# Patient Record
Sex: Female | Born: 1985 | Race: Black or African American | Hispanic: No | Marital: Single | Smoking: Never smoker
Health system: Southern US, Community
[De-identification: ages and names within clinical notes are randomized; demographics above are authoritative.]

## PROBLEM LIST (undated history)

## (undated) DIAGNOSIS — N809 Endometriosis, unspecified: Secondary | ICD-10-CM

---

## 2017-02-16 ENCOUNTER — Emergency Department
Admission: EM | Admit: 2017-02-16 | Discharge: 2017-02-16 | Disposition: A | Discharge status: Home / Self Care | Payer: Self-pay | Attending: Emergency Medicine | Admitting: Emergency Medicine

## 2017-02-16 ENCOUNTER — Encounter: Payer: Self-pay | Admitting: *Deleted

## 2017-02-16 DIAGNOSIS — N760 Acute vaginitis: Secondary | ICD-10-CM | POA: Insufficient documentation

## 2017-02-16 DIAGNOSIS — R300 Dysuria: Secondary | ICD-10-CM | POA: Insufficient documentation

## 2017-02-16 LAB — WET PREP, GENITAL
Clue Cells Wet Prep HPF POC: NONE SEEN
SPERM: NONE SEEN
TRICH WET PREP: NONE SEEN
YEAST WET PREP: NONE SEEN

## 2017-02-16 LAB — CHLAMYDIA/NGC RT PCR (ARMC ONLY)
Chlamydia Tr: NOT DETECTED
N GONORRHOEAE: NOT DETECTED

## 2017-02-16 LAB — URINALYSIS, COMPLETE (UACMP) WITH MICROSCOPIC
Bilirubin Urine: NEGATIVE
Glucose, UA: NEGATIVE mg/dL
Hgb urine dipstick: NEGATIVE
KETONES UR: NEGATIVE mg/dL
LEUKOCYTES UA: NEGATIVE
Nitrite: NEGATIVE
PROTEIN: NEGATIVE mg/dL
Specific Gravity, Urine: 1.014 (ref 1.005–1.030)
pH: 5 (ref 5.0–8.0)

## 2017-02-16 LAB — POCT PREGNANCY, URINE: Preg Test, Ur: NEGATIVE

## 2017-02-16 NOTE — ED Triage Notes (Signed)
Pt reports dysuria with a vaginal discharge for 2 days , pt denies any other symptoms

## 2017-02-16 NOTE — ED Provider Notes (Signed)
[facility] Emergency Department Provider Note ____________________________________________  Time seen: 1145  I have reviewed the triage vital signs and the nursing notes.  HISTORY  Chief Complaint  Dysuria  HPI Charlotte Rodgers is a 31 y.o. female presents to the ED with 2 day complaint of dysuria and vaginal discharge. She denies any fevers, chills, or nausea. She also denies any pelvic pain, flank pain, or abdominal pain. She describes the discharge as white and thick. She denies any abnormal bleeding.  History reviewed. No pertinent past medical history.  There are no active problems to display for this patient.  History reviewed. No pertinent surgical history.  Prior to Admission medications   Not on File    Allergies Patient has no known allergies.  No family history on file.  Social History Social History  Substance Use Topics  . Smoking status: Not on file  . Smokeless tobacco: Not on file  . Alcohol use No    Review of Systems  Constitutional: Negative for fever. Cardiovascular: Negative for chest pain. Respiratory: Negative for shortness of breath. Gastrointestinal: Negative for abdominal pain, vomiting and diarrhea. Genitourinary: :Positive for dysuria and vaginal discharge. Musculoskeletal: Negative for back pain. ____________________________________________  PHYSICAL EXAM:  VITAL SIGNS: ED Triage Vitals  Enc Vitals Group     BP 02/16/17 1132 111/65     Pulse Rate 02/16/17 1132 76     Resp 02/16/17 1132 20     Temp 02/16/17 1132 98.5 F (36.9 C)     Temp Source 02/16/17 1132 Oral     SpO2 02/16/17 1132 100 %     Weight 02/16/17 1133 140 lb (63.5 kg)     Height 02/16/17 1133  (1.651 m)     Head Circumference --      Peak Flow --      Pain Score --      Pain Loc --      Pain Edu? --      Excl. in GC? --     Constitutional: Alert and oriented. Well appearing and in no distress. Head: Normocephalic and  atraumatic. Cardiovascular: Normal rate, regular rhythm. Normal distal pulses. Respiratory: Normal respiratory effort. No wheezes/rales/rhonchi. GU: normal external genitalia. Moderate white, thick discharge in the canal. Cervix is closed. No lesions noted.  No adnexal masses or CMT noted.  ____________________________________________   LABS (pertinent positives/negatives)  Labs Reviewed  WET PREP, GENITAL - Abnormal; Notable for the following:       Result Value   WBC, Wet Prep HPF POC MODERATE (*)    All other components within normal limits  URINALYSIS, COMPLETE (UACMP) WITH MICROSCOPIC - Abnormal; Notable for the following:    Color, Urine YELLOW (*)    APPearance CLEAR (*)    Bacteria, UA RARE (*)    Squamous Epithelial / LPF 0-5 (*)    All other components within normal limits  CHLAMYDIA/NGC RT PCR ([facility] ONLY)  POC URINE PREG, ED  POCT PREGNANCY, URINE  ____________________________________________  INITIAL IMPRESSION / ASSESSMENT AND PLAN / ED COURSE  atient with the ED evaluation of dysuria and some vaginal discharge. Her exam is overall benign. Her wet prep and urine are both negative for any acute infectious process. The patient's GC was also negative at this time. She is discharged with instructions to follow with local clinic for valuation of continuous symptoms. Her symptoms may represent a physiologic discharge at this time. Return precautions are reviewed. ____________________________________________  FINAL CLINICAL IMPRESSION(S) / ED DIAGNOSES  Final diagnoses:  Acute vaginitis      Lissa Hoard, PA-C 02/16/17 1941    Sharman Cheek, MD 02/18/17 [phone]

## 2017-02-16 NOTE — Discharge Instructions (Signed)
Your exam and lab tests were negative today. Continue to monitor symptoms and follow-up with [facility] or a local provider. Return to the ED as needed.

## 2018-09-17 ENCOUNTER — Encounter: Payer: Self-pay | Admitting: Emergency Medicine

## 2018-09-17 ENCOUNTER — Emergency Department: Payer: Medicaid Other

## 2018-09-17 ENCOUNTER — Emergency Department
Admission: EM | Admit: 2018-09-17 | Discharge: 2018-09-17 | Disposition: A | Discharge status: Home / Self Care | Payer: Medicaid Other | Attending: Emergency Medicine | Admitting: Emergency Medicine

## 2018-09-17 ENCOUNTER — Other Ambulatory Visit: Payer: Self-pay

## 2018-09-17 DIAGNOSIS — R1031 Right lower quadrant pain: Secondary | ICD-10-CM | POA: Diagnosis not present

## 2018-09-17 DIAGNOSIS — R7401 Elevation of levels of liver transaminase levels: Secondary | ICD-10-CM

## 2018-09-17 DIAGNOSIS — R740 Nonspecific elevation of levels of transaminase and lactic acid dehydrogenase [LDH]: Secondary | ICD-10-CM | POA: Diagnosis not present

## 2018-09-17 HISTORY — DX: Endometriosis, unspecified: N80.9

## 2018-09-17 LAB — COMPREHENSIVE METABOLIC PANEL
ALT: 350 U/L — ABNORMAL HIGH (ref 0–44)
AST: 183 U/L — ABNORMAL HIGH (ref 15–41)
Albumin: 4.3 g/dL (ref 3.5–5.0)
Alkaline Phosphatase: 59 U/L (ref 38–126)
Anion gap: 10 (ref 5–15)
BUN: 14 mg/dL (ref 6–20)
CO2: 25 mmol/L (ref 22–32)
Calcium: 9 mg/dL (ref 8.9–10.3)
Chloride: 102 mmol/L (ref 98–111)
Creatinine, Ser: 0.76 mg/dL (ref 0.44–1.00)
GFR calc Af Amer: >60 mL/min (ref >60)
GFR calc non Af Amer: >60 mL/min (ref >60)
Glucose, Bld: 99 mg/dL (ref 70–99)
Potassium: 4.1 mmol/L (ref 3.5–5.1)
Sodium: 137 mmol/L (ref 135–145)
Total Bilirubin: 0.5 mg/dL (ref 0.3–1.2)
Total Protein: 8.4 g/dL — ABNORMAL HIGH (ref 6.5–8.1)

## 2018-09-17 LAB — CBC WITH DIFFERENTIAL/PLATELET
Abs Immature Granulocytes: 0.02 10*3/uL (ref 0.00–0.07)
Basophils Absolute: 0 10*3/uL (ref 0.0–0.1)
Basophils Relative: 0 %
Eosinophils Absolute: 0.1 10*3/uL (ref 0.0–0.5)
Eosinophils Relative: 1 %
HCT: 36.3 % (ref 36.0–46.0)
Hemoglobin: 11.5 g/dL — ABNORMAL LOW (ref 12.0–15.0)
Immature Granulocytes: 0 %
Lymphocytes Relative: 33 %
Lymphs Abs: 2.1 10*3/uL (ref 0.7–4.0)
MCH: 27.1 pg (ref 26.0–34.0)
MCHC: 31.7 g/dL (ref 30.0–36.0)
MCV: 85.4 fL (ref 80.0–100.0)
Monocytes Absolute: 0.7 10*3/uL (ref 0.1–1.0)
Monocytes Relative: 10 %
Neutro Abs: 3.5 10*3/uL (ref 1.7–7.7)
Neutrophils Relative %: 56 %
Platelets: 230 10*3/uL (ref 150–400)
RBC: 4.25 MIL/uL (ref 3.87–5.11)
RDW: 12.3 % (ref 11.5–15.5)
WBC: 6.3 10*3/uL (ref 4.0–10.5)
nRBC: 0 % (ref 0.0–0.2)

## 2018-09-17 LAB — URINALYSIS, COMPLETE (UACMP) WITH MICROSCOPIC
Bilirubin Urine: NEGATIVE
Glucose, UA: NEGATIVE mg/dL
Ketones, ur: NEGATIVE mg/dL
Leukocytes,Ua: NEGATIVE
Nitrite: NEGATIVE
Protein, ur: NEGATIVE mg/dL
Specific Gravity, Urine: 1.006 (ref 1.005–1.030)
pH: 6 (ref 5.0–8.0)

## 2018-09-17 LAB — LIPASE, BLOOD: Lipase: 27 U/L (ref 11–51)

## 2018-09-17 LAB — PREGNANCY, URINE: Preg Test, Ur: NEGATIVE

## 2018-09-17 MED ORDER — ONDANSETRON HCL 4 MG/2ML IJ SOLN
4.0000 mg | Freq: Once | INTRAMUSCULAR | Status: AC
  Administered 2018-09-17: 4 mg via INTRAVENOUS
  Filled 2018-09-17: qty 2

## 2018-09-17 MED ORDER — KETOROLAC TROMETHAMINE 10 MG PO TABS: 10.0000 mg | ORAL_TABLET | Freq: Four times a day (QID) | ORAL | 0 refills | Status: DC | PRN

## 2018-09-17 MED ORDER — MORPHINE SULFATE (PF) 4 MG/ML IV SOLN
4.0000 mg | Freq: Once | INTRAVENOUS | Status: DC
  Filled 2018-09-17: qty 1

## 2018-09-17 MED ORDER — KETOROLAC TROMETHAMINE 30 MG/ML IJ SOLN
15.0000 mg | INTRAMUSCULAR | Status: AC
  Administered 2018-09-17: 15 mg via INTRAVENOUS
  Filled 2018-09-17: qty 1

## 2018-09-17 MED ORDER — IOHEXOL 300 MG/ML  SOLN
100.0000 mL | Freq: Once | INTRAMUSCULAR | Status: AC | PRN
  Administered 2018-09-17: 100 mL via INTRAVENOUS

## 2018-09-17 MED ORDER — SODIUM CHLORIDE 0.9 % IV BOLUS
1000.0000 mL | Freq: Once | INTRAVENOUS | Status: AC
  Administered 2018-09-17: 1000 mL via INTRAVENOUS

## 2018-09-17 NOTE — ED Triage Notes (Signed)
Patient presents to ED via POV from home with c/o RLQ pain that began on Saturday. Denies V/D, dose endorse nausea. Patient describes the pain as a "pinching/stabbing" pain. Ambulatory to triage. Guarding noted.

## 2018-09-17 NOTE — ED Notes (Signed)
Patient given water per MD ok 

## 2018-09-17 NOTE — ED Notes (Signed)
Patient transported to Ultrasound 

## 2018-09-17 NOTE — ED Notes (Signed)
Patient offered pelvic exam by MD to check for STDs after she related concern about PID. Patient denies abnormal bleeding, discharge or urinary problems. Patient denied pelvic exam.

## 2018-09-17 NOTE — Discharge Instructions (Signed)
Your lab tests, CT scan of the abdomen, and pelvic ultrasound were all okay today.  We are not able to find a clear cause of your symptoms.  Your liver enzymes are abnormally elevated, which does not have an apparent cause at this time and should be followed up by your primary care doctor.  You can also follow-up with gastroenterology for further evaluation of this lab finding.

## 2018-09-17 NOTE — ED Notes (Signed)
EDP to bedside, updated plan of care provided. 

## 2018-09-17 NOTE — ED Provider Notes (Signed)
[facility] Emergency Department Provider Note  ____________________________________________  Time seen: Approximately 7:59 AM  I have reviewed the triage vital signs and the nursing notes.   HISTORY  Chief Complaint Abdominal Pain    HPI Charlotte Rodgers is a 33 y.o. female with a history of endometriosis who complains of gradual onset of right lower quadrant abdominal pain that is been constant for the past 4 days.  Described as a pinching/stabbing pain, severe.  Nonradiating.  No aggravating or alleviating factors.  Associated with nausea.  Last menstrual period was March 18.  Normally has regular menses.  Denies any abnormal vaginal bleeding or discharge.  Denies any dysuria frequency urgency.  Eating and drinking normally.      Past Medical History:  Diagnosis Date  . Endometriosis      There are no active problems to display for this patient.    Past surgical history significant for laparoscopic ablation of endometriosis peritoneal implants   Prior to Admission medications   Medication Sig Start Date End Date Taking? Authorizing Provider  fluticasone (FLONASE) 50 MCG/ACT nasal spray Place 1 spray into both nostrils daily. 08/14/18  Yes [provider]  loratadine (CLARITIN) 10 MG tablet Take 10 mg by mouth daily. 08/14/18  Yes [provider]  ketorolac (TORADOL) 10 MG tablet Take 1 tablet (10 mg total) by mouth every 6 (six) hours as needed for moderate pain. 09/17/18   Sharman Cheek, MD  None   Allergies Patient has no known allergies.   No family history on file.  Social History Social History   Tobacco Use  . Smoking status: Never Smoker  . Smokeless tobacco: Never Used  Substance Use Topics  . Alcohol use: No  . Drug use: Never    Review of Systems  Constitutional:   No fever or chills.  ENT:   No sore throat. No rhinorrhea. Cardiovascular:   No chest pain or syncope. Respiratory:   No dyspnea or  cough. Gastrointestinal:   Positive as above for abdominal pain without vomiting and diarrhea.  Musculoskeletal:   Negative for focal pain or swelling All other systems reviewed and are negative except as documented above in ROS and HPI.  ____________________________________________   PHYSICAL EXAM:  VITAL SIGNS: ED Triage Vitals [09/17/18 0725]  Enc Vitals Group     BP 128/61     Pulse Rate 86     Resp 17     Temp 97.6 F (36.4 C)     Temp Source Oral     SpO2 98 %     Weight 150 lb (68 kg)     Height  (1.651 m)     Head Circumference      Peak Flow      Pain Score 10     Pain Loc      Pain Edu?      Excl. in GC?     Vital signs reviewed, nursing assessments reviewed.   Constitutional:   Alert and oriented. Non-toxic appearance. Eyes:   Conjunctivae are normal. EOMI. PERRL. ENT      Head:   Normocephalic and atraumatic.      Nose:   Covered by facemask      Mouth/Throat:   Covered by facemask      Neck:   No meningismus. Full ROM. Hematological/Lymphatic/Immunilogical:   No cervical lymphadenopathy. Cardiovascular:   RRR. Symmetric bilateral radial and DP pulses.  No murmurs. Cap refill less than 2 seconds. Respiratory:  Normal respiratory effort without tachypnea/retractions. Breath sounds are clear and equal bilaterally. No wheezes/rales/rhonchi. Gastrointestinal:   Soft with focal right lower quadrant tenderness. Non distended. There is no CVA tenderness.  No rebound, rigidity, or guarding.  Positive obturator sign. Genitourinary: Patient declines Musculoskeletal:   Normal range of motion in all extremities. No joint effusions.  No lower extremity tenderness.  No edema. Neurologic:   Normal speech and language.  Motor grossly intact. No acute focal neurologic deficits are appreciated.  Skin:    Skin is warm, dry and intact. No rash noted.  No petechiae, purpura, or bullae.  ____________________________________________    LABS (pertinent  positives/negatives) (all labs ordered are listed, but only abnormal results are displayed) Labs Reviewed  COMPREHENSIVE METABOLIC PANEL - Abnormal; Notable for the following components:      Result Value   Total Protein 8.4 (*)    AST 183 (*)    ALT 350 (*)    All other components within normal limits  CBC WITH DIFFERENTIAL/PLATELET - Abnormal; Notable for the following components:   Hemoglobin 11.5 (*)    All other components within normal limits  URINALYSIS, COMPLETE (UACMP) WITH MICROSCOPIC - Abnormal; Notable for the following components:   Color, Urine STRAW (*)    APPearance CLEAR (*)    Hgb urine dipstick SMALL (*)    Bacteria, UA RARE (*)    All other components within normal limits  LIPASE, BLOOD  PREGNANCY, URINE   ____________________________________________   EKG    ____________________________________________    RADIOLOGY  US Transvaginal Non-ob  Result Date: 09/17/2018 CLINICAL DATA:  33 year old female with acute RIGHT pelvic pain for 4 days. History of endometriosis. EXAM: TRANSABDOMINAL AND TRANSVAGINAL ULTRASOUND OF PELVIS DOPPLER ULTRASOUND OF OVARIES TECHNIQUE: Both transabdominal and transvaginal ultrasound examinations of the pelvis were performed. Transabdominal technique was performed for global imaging of the pelvis including uterus, ovaries, adnexal regions, and pelvic cul-de-sac. It was necessary to proceed with endovaginal exam following the transabdominal exam to visualize the endometrium and ovaries. Color and duplex Doppler ultrasound was utilized to evaluate blood flow to the ovaries. COMPARISON:  None. FINDINGS: Uterus Measurements: 9 x 4.4 x 5.4 cm = volume: 108 mL. No fibroids or other mass visualized. Endometrium Thickness: 10 mm.  No focal abnormality visualized. Right ovary Measurements: 3.9 x 1.8 x 2 cm = volume: 7.3 mL. Normal appearance/no suspicious adnexal mass. Left ovary Measurements: 3.1 x 2.4 x 2.4 cm = volume: 9.3 mL. Normal  appearance/no suspicious adnexal mass. Pulsed Doppler evaluation of both ovaries demonstrates normal low-resistance arterial and venous waveforms. Other findings A trace amount of free pelvic fluid is noted. IMPRESSION: 1. Unremarkable pelvic ultrasound except for trace amount of free fluid which may be physiologic. No evidence of ovarian torsion. Electronically Signed   By: Harmon Pier M.D.   On: 09/17/2018 12:08   US Pelvis Complete  Result Date: 09/17/2018 CLINICAL DATA:  33 year old female with acute RIGHT pelvic pain for 4 days. History of endometriosis. EXAM: TRANSABDOMINAL AND TRANSVAGINAL ULTRASOUND OF PELVIS DOPPLER ULTRASOUND OF OVARIES TECHNIQUE: Both transabdominal and transvaginal ultrasound examinations of the pelvis were performed. Transabdominal technique was performed for global imaging of the pelvis including uterus, ovaries, adnexal regions, and pelvic cul-de-sac. It was necessary to proceed with endovaginal exam following the transabdominal exam to visualize the endometrium and ovaries. Color and duplex Doppler ultrasound was utilized to evaluate blood flow to the ovaries. COMPARISON:  None. FINDINGS: Uterus Measurements: 9 x 4.4 x 5.4 cm = volume:  108 mL. No fibroids or other mass visualized. Endometrium Thickness: 10 mm.  No focal abnormality visualized. Right ovary Measurements: 3.9 x 1.8 x 2 cm = volume: 7.3 mL. Normal appearance/no suspicious adnexal mass. Left ovary Measurements: 3.1 x 2.4 x 2.4 cm = volume: 9.3 mL. Normal appearance/no suspicious adnexal mass. Pulsed Doppler evaluation of both ovaries demonstrates normal low-resistance arterial and venous waveforms. Other findings A trace amount of free pelvic fluid is noted. IMPRESSION: 1. Unremarkable pelvic ultrasound except for trace amount of free fluid which may be physiologic. No evidence of ovarian torsion. Electronically Signed   By: Harmon Pier M.D.   On: 09/17/2018 12:08   Ct Abdomen Pelvis W Contrast  Result Date:  09/17/2018 CLINICAL DATA:  Abdominal pain, primarily right-sided EXAM: CT ABDOMEN AND PELVIS WITH CONTRAST TECHNIQUE: Multidetector CT imaging of the abdomen and pelvis was performed using the standard protocol following bolus administration of intravenous contrast. CONTRAST:  OMNIPAQUE IOHEXOL 300 MG/ML  SOLN COMPARISON:  None. FINDINGS: Lower chest: Lung bases are clear. Hepatobiliary: No focal liver lesions are appreciable. The gallbladder wall is not appreciably thickened. There is no biliary duct dilatation. Pancreas: No pancreatic mass or inflammatory focus. Spleen: No splenic lesions are evident. Adrenals/Urinary Tract: Adrenals bilaterally appear unremarkable. Kidneys bilaterally show no appreciable mass or hydronephrosis on either side. There is no evident renal or ureteral calculus. Urinary bladder is midline with wall thickness within normal limits. Stomach/Bowel: Rectum is mildly distended with stool. There is no perirectal wall thickening or soft tissue stranding. There is moderate stool elsewhere in the colon. There is no appreciable bowel wall or mesenteric thickening. There is no appreciable bowel obstruction. There is no free air or portal venous air. Vascular/Lymphatic: There is no abdominal aortic aneurysm. No vascular lesions are evident. There is a circumaortic left renal vein, an anatomic variant. There is no evident adenopathy in the abdomen or pelvis. Reproductive: The uterus is anteverted. There is no appreciable pelvic mass. There are prominent periuterine vascular structures, particularly on the left. Other: Appendix appears normal. There is no abscess or ascites in the abdomen or pelvis. There is a minimal ventral hernia containing only fat. Musculoskeletal: No blastic or lytic bone lesions. No intramuscular lesions evident. IMPRESSION: 1. A cause for patient's symptoms has not been established definitively with this study. 2. No bowel obstruction. No abscess in the abdomen pelvis.  The appendix appears normal. 3. No appreciable renal or ureteral calculus. No hydronephrosis. Urinary bladder wall thickness is normal. 4. Prominent periuterine vascular structures on the left. In the appropriate clinical setting, this finding could be indicative of a degree of pelvic congestion syndrome. 5.  Minimal ventral hernia containing only fat. Electronically Signed   By: Bretta Bang III M.D.   On: 09/17/2018 10:00   [place] Art/ven Flow Abd Pelv Doppler  Result Date: 09/17/2018 CLINICAL DATA:  33 year old female with acute RIGHT pelvic pain for 4 days. History of endometriosis. EXAM: TRANSABDOMINAL AND TRANSVAGINAL ULTRASOUND OF PELVIS DOPPLER ULTRASOUND OF OVARIES TECHNIQUE: Both transabdominal and transvaginal ultrasound examinations of the pelvis were performed. Transabdominal technique was performed for global imaging of the pelvis including uterus, ovaries, adnexal regions, and pelvic cul-de-sac. It was necessary to proceed with endovaginal exam following the transabdominal exam to visualize the endometrium and ovaries. Color and duplex Doppler ultrasound was utilized to evaluate blood flow to the ovaries. COMPARISON:  None. FINDINGS: Uterus Measurements: 9 x 4.4 x 5.4 cm = volume: 108 mL. No fibroids or other mass visualized. Endometrium  Thickness: 10 mm.  No focal abnormality visualized. Right ovary Measurements: 3.9 x 1.8 x 2 cm = volume: 7.3 mL. Normal appearance/no suspicious adnexal mass. Left ovary Measurements: 3.1 x 2.4 x 2.4 cm = volume: 9.3 mL. Normal appearance/no suspicious adnexal mass. Pulsed Doppler evaluation of both ovaries demonstrates normal low-resistance arterial and venous waveforms. Other findings A trace amount of free pelvic fluid is noted. IMPRESSION: 1. Unremarkable pelvic ultrasound except for trace amount of free fluid which may be physiologic. No evidence of ovarian torsion. Electronically Signed   By: Harmon Pier M.D.   On: 09/17/2018 12:08     ____________________________________________   PROCEDURES Procedures  ____________________________________________  DIFFERENTIAL DIAGNOSIS   Appendicitis, ovarian cyst, pregnancy, ectopic pregnancy, constipation, colitis.  CLINICAL IMPRESSION / ASSESSMENT AND PLAN / ED COURSE  Medications ordered in the ED: Medications  ondansetron (ZOFRAN) injection 4 mg (4 mg Intravenous Given 09/17/18 0831)  ketorolac (TORADOL) 30 MG/ML injection 15 mg (15 mg Intravenous Given 09/17/18 0831)  sodium chloride 0.9 % bolus 1,000 mL (0 mLs Intravenous Stopped 09/17/18 0930)  iohexol (OMNIPAQUE) 300 MG/ML solution 100 mL (100 mLs Intravenous Contrast Given 09/17/18 0945)    Pertinent labs & imaging results that were available during my care of the patient were reviewed by me and considered in my medical decision making (see chart for details).  Charlotte Rodgers was evaluated in Emergency Department on 09/17/2018 for the symptoms described in the history of present illness. She was evaluated in the context of the global COVID-19 pandemic, which necessitated consideration that the patient might be at risk for infection with the SARS-CoV-2 virus that causes COVID-19. Institutional protocols and algorithms that pertain to the evaluation of patients at risk for COVID-19 are in a state of rapid change based on information released by regulatory bodies including the CDC and federal and state organizations. These policies and algorithms were followed during the patient's care in the ED.   Patient presents with right lower quadrant pain and tenderness, most worrisome for appendicitis.  Given 4 days of symptoms, doubt torsion.  Low suspicion for STI PID TOA.  Will proceed with labs and CT scan of the abdomen and pelvis.  Symptom relief with Zofran 4 mg IV, Toradol 15 mg IV, morphine 4 mg IV.  IV fluids for hydration.  Clinical Course as of Sep 16 1408  Tue Sep 17, 2018  1056 Work-up unremarkable except for elevation of  transaminases from unclear cause.  No signs of biliary obstruction or cholecystitis on CT.  Patient was reexamined, no right upper quadrant tenderness at all but persistent right lower quadrant pain and tenderness, so I will obtain a pelvic ultrasound.   [PS]  1243 Ultrasound pelvis unremarkable without signs of cyst, pathologic fluid collection, or torsion.  No further work-up needed at this time.  Given timing of her last menstrual period, at this point I suspect her pain is due to onset of menses with possibly some contribution of recurrent endometriosis.   [PS]    Clinical Course User Index [PS] Sharman Cheek, MD      ----------------------------------------- 2:08 PM on 09/17/2018 -----------------------------------------  Vitals remained stable and normal.  Pelvic ultrasound unremarkable.  Offered pelvic exam but patient refuses. No change in vaginal discharge. rec f/u with PCP and GI. ____________________________________________   FINAL CLINICAL IMPRESSION(S) / ED DIAGNOSES    Final diagnoses:  Right lower quadrant abdominal pain  Elevated transaminase level     ED Discharge Orders  Ordered    ketorolac (TORADOL) 10 MG tablet  Every 6 hours PRN     09/17/18 1407          Portions of this note were generated with dragon dictation software. Dictation errors may occur despite best attempts at proofreading.   Sharman CheekStafford, Retina Bernardy, MD 09/17/18 [phone]

## 2018-09-17 NOTE — ED Notes (Signed)
Patient transported to CT 

## 2019-01-24 ENCOUNTER — Ambulatory Visit (INDEPENDENT_AMBULATORY_CARE_PROVIDER_SITE_OTHER): Payer: Medicaid Other | Admitting: Maternal Newborn

## 2019-01-24 ENCOUNTER — Other Ambulatory Visit (HOSPITAL_COMMUNITY)
Admission: RE | Admit: 2019-01-24 | Discharge: 2019-01-24 | Disposition: A | Discharge status: Home / Self Care | Payer: Medicaid Other | Source: Ambulatory Visit | Attending: Maternal Newborn | Admitting: Maternal Newborn

## 2019-01-24 ENCOUNTER — Other Ambulatory Visit: Payer: Self-pay

## 2019-01-24 ENCOUNTER — Encounter: Payer: Self-pay | Admitting: Maternal Newborn

## 2019-01-24 VITALS — BP 120/60 | Ht 65.0 in | Wt 168.0 lb

## 2019-01-24 DIAGNOSIS — Z124 Encounter for screening for malignant neoplasm of cervix: Secondary | ICD-10-CM | POA: Diagnosis not present

## 2019-01-24 DIAGNOSIS — Z01419 Encounter for gynecological examination (general) (routine) without abnormal findings: Secondary | ICD-10-CM

## 2019-01-24 DIAGNOSIS — N898 Other specified noninflammatory disorders of vagina: Secondary | ICD-10-CM | POA: Diagnosis present

## 2019-01-24 DIAGNOSIS — Z0000 Encounter for general adult medical examination without abnormal findings: Secondary | ICD-10-CM

## 2019-01-24 DIAGNOSIS — Z113 Encounter for screening for infections with a predominantly sexual mode of transmission: Secondary | ICD-10-CM | POA: Insufficient documentation

## 2019-01-24 NOTE — Progress Notes (Signed)
Gynecology Annual Exam  PCP: Patient, No Pcp Per  Chief Complaint:  Chief Complaint  Patient presents with  . Gynecologic Exam  . Vaginal Discharge    odor, no itchiness or irritation x 2 weeks  . STD testing    History of Present Illness: Patient is a 33 y.o. [id number] presenting for an annual exam.   LMP: Patient's last menstrual period was 12/28/2018 (exact date). Average Interval: regular, 28 days Duration of flow: 5-6 days Heavy Menses: no Clots: no Intermenstrual Bleeding: no Postcoital Bleeding: no Dysmenorrhea: mild cramping  The patient is not currently sexually active. She currently uses no method for contraception. The patient does perform self breast exams.  There is notable family history of breast cancer in her maternal cousin and ovarian cancer in her maternal grandmother.  The patient wears seatbelts: yes.   The patient has regular exercise: yes, 6/days per week.    The patient does not have current symptoms of depression.   Domestic violence screening is negative today.  Review of Systems  Constitutional: Negative.   HENT: Negative.   Eyes: Negative.   Respiratory: Negative for cough, shortness of breath and wheezing.   Cardiovascular: Negative for chest pain and palpitations.  Gastrointestinal: Positive for constipation. Negative for abdominal pain and nausea.  Genitourinary:       Bloating Right sided pain during week before period Vaginal discharge past 2 weeks  Skin: Negative.   Neurological: Negative.   Endo/Heme/Allergies: Negative.   Psychiatric/Behavioral: Negative.   All other systems reviewed and are negative.   Past Medical History:  Past Medical History:  Diagnosis Date  . Endometriosis     Past Surgical History:  History reviewed. No pertinent surgical history.  Gynecologic History:  Patient's last menstrual period was 12/28/2018 (exact date). Contraception: abstinence Last Pap: Several years ago. Results were: normal per  patient  Obstetric History: [id number]  Family History:  History reviewed. No pertinent family history.  Social History:  Social History   Socioeconomic History  . Marital status: Single    Spouse name: Not on file  . Number of children: Not on file  . Years of education: Not on file  . Highest education level: Not on file  Occupational History  . Not on file  Social Needs  . Financial resource strain: Not on file  . Food insecurity    Worry: Not on file    Inability: Not on file  . Transportation needs    Medical: Not on file    Non-medical: Not on file  Tobacco Use  . Smoking status: Never Smoker  . Smokeless tobacco: Never Used  Substance and Sexual Activity  . Alcohol use: No  . Drug use: Never  . Sexual activity: Not Currently    Birth control/protection: None  Lifestyle  . Physical activity    Days per week: Not on file    Minutes per session: Not on file  . Stress: Not on file  Relationships  . Social Herbalist on phone: Not on file    Gets together: Not on file    Attends religious service: Not on file    Active member of club or organization: Not on file    Attends meetings of clubs or organizations: Not on file    Relationship status: Not on file  . Intimate partner violence    Fear of current or ex partner: Not on file    Emotionally abused: Not on file  Physically abused: Not on file    Forced sexual activity: Not on file  Other Topics Concern  . Not on file  Social History Narrative  . Not on file    Allergies:  No Known Allergies  Medications: Prior to Admission medications   Medication Sig Start Date End Date Taking? Authorizing Provider  fluticasone (FLONASE) 50 MCG/ACT nasal spray Place 1 spray into both nostrils daily. 08/14/18  Yes [provider]  loratadine (CLARITIN) 10 MG tablet Take 10 mg by mouth daily. 08/14/18  Yes [provider]    Physical Exam Vitals: Blood pressure 120/60, height 5\' 5"   (1.651 m), weight 168 lb (76.2 kg), last menstrual period 12/28/2018.  General: NAD HEENT: normocephalic, anicteric Thyroid: no enlargement Pulmonary: No increased work of breathing, CTAB Cardiovascular: RRR, distal pulses 2+ Breast: Breasts symmetrical, no tenderness, no palpable nodules or masses, no skin or nipple retraction present, no nipple discharge.  No axillary or supraclavicular lymphadenopathy. Abdomen: Soft, non-tender, non-distended.  Umbilicus without lesions.  No hepatomegaly, splenomegaly or masses palpable. No evidence of hernia  Genitourinary:  External: Normal external female genitalia.  Normal urethral  meatus, normal Bartholin's and Skene's glands.    Vagina: Normal vaginal mucosa, no evidence of prolapse.    Cervix: Grossly normal in appearance, no bleeding  Uterus: Non-enlarged, mobile, normal contour.  No CMT  Adnexa: ovaries non-enlarged, no adnexal masses  Rectal: deferred  Lymphatic: no evidence of inguinal lymphadenopathy Extremities: no edema, erythema, or tenderness Neurologic: Grossly intact Psychiatric: mood appropriate, affect full  Assessment: 33 y.o. [id number] annual exam  Plan: Problem List Items Addressed This Visit    None    Visit Diagnoses    Encounter for annual routine gynecological examination    -  Primary   Vaginal discharge       Relevant Orders   Cervicovaginal ancillary only   Pap smear for cervical cancer screening       Relevant Orders   Cytology - PAP   Routine screening for STI (sexually transmitted infection)       Relevant Orders   Cytology - PAP      1) STI screening was offered and accepted  2) ASCCP guidelines and rationale discussed.  Patient opts for every 3 year screening interval  3) Contraception - Currently not sexually active and does not desire contraception at this time.  4) Aptima sent for increase in vaginal discharge and patient's concern that she may have BV.  5) History of endometriosis, records show  recent normal ultrasound on 09/17/2018 for same right pelvic pain. Pain is usually around the week before her period presently. Recommended follow up with MD with worsening or more frequent symptoms.  6) Follow up 1 year for annual exam.  Marcelyn BruinsJacelyn , CNM 01/24/2019  2:04 PM

## 2019-01-29 LAB — CYTOLOGY - PAP
Chlamydia: NEGATIVE
Diagnosis: NEGATIVE
HPV: NOT DETECTED
Neisseria Gonorrhea: NEGATIVE
Trichomonas: NEGATIVE

## 2019-01-29 LAB — CERVICOVAGINAL ANCILLARY ONLY
Bacterial vaginitis: NEGATIVE
Candida vaginitis: NEGATIVE

## 2019-01-31 ENCOUNTER — Telehealth: Payer: Self-pay | Admitting: Maternal Newborn

## 2019-01-31 NOTE — Telephone Encounter (Signed)
Patient following up on lab results from annual 8/28.

## 2019-06-20 ENCOUNTER — Ambulatory Visit: Payer: Medicaid Other | Admitting: Obstetrics and Gynecology

## 2019-06-25 ENCOUNTER — Encounter: Payer: Self-pay | Admitting: Obstetrics and Gynecology

## 2019-06-25 ENCOUNTER — Other Ambulatory Visit: Payer: Self-pay

## 2019-06-25 ENCOUNTER — Ambulatory Visit (INDEPENDENT_AMBULATORY_CARE_PROVIDER_SITE_OTHER): Payer: Medicaid Other | Admitting: Obstetrics and Gynecology

## 2019-06-25 VITALS — BP 100/80 | Ht 65.0 in | Wt 169.0 lb

## 2019-06-25 DIAGNOSIS — Z803 Family history of malignant neoplasm of breast: Secondary | ICD-10-CM

## 2019-06-25 DIAGNOSIS — L723 Sebaceous cyst: Secondary | ICD-10-CM | POA: Diagnosis not present

## 2019-06-25 DIAGNOSIS — Z7183 Encounter for nonprocreative genetic counseling: Secondary | ICD-10-CM

## 2019-06-25 DIAGNOSIS — Z1231 Encounter for screening mammogram for malignant neoplasm of breast: Secondary | ICD-10-CM | POA: Diagnosis not present

## 2019-06-25 DIAGNOSIS — N644 Mastodynia: Secondary | ICD-10-CM | POA: Diagnosis not present

## 2019-06-25 NOTE — Progress Notes (Addendum)
Patient, No Pcp Per   Chief Complaint  Patient presents with  . Breast exam    tenderness of both breast, no lumps/discharge noticed    HPI:      Ms. Charlotte Rodgers is a 34 y.o. [phone] who LMP was Patient's last menstrual period was 05/31/2019 (exact date)., presents today for bilat breast tenderness for the past 2 yrs. No masses/lumps/nipple d/c. Bras aren't comfortable. Minimal to no caffeine use. Sx worse luteal phase. Menses are monthly. Pt not sex active. FH breast cancer in her MGM, MGGM, mat grt aunt and mat cousin. FH ovar cancer in her MGM. Genetic testing not done. Pt also with mass on LT upper back--trying to see derm.   Past Medical History:  Diagnosis Date  . Endometriosis   . Family history of breast cancer     History reviewed. No pertinent surgical history.  Family History  Problem Relation Age of Onset  . Breast cancer Maternal Grandmother 45  . Ovarian cancer Maternal Grandmother   . Breast cancer Cousin 40  . Breast cancer Other 41  . Breast cancer Maternal Great-grandmother 52    Social History   Socioeconomic History  . Marital status: Single    Spouse name: Not on file  . Number of children: Not on file  . Years of education: Not on file  . Highest education level: Not on file  Occupational History  . Not on file  Tobacco Use  . Smoking status: Never Smoker  . Smokeless tobacco: Never Used  Substance and Sexual Activity  . Alcohol use: No  . Drug use: Never  . Sexual activity: Not Currently    Birth control/protection: None  Other Topics Concern  . Not on file  Social History Narrative  . Not on file   Social Determinants of Health   Financial Resource Strain:   . Difficulty of Paying Living Expenses: Not on file  Food Insecurity:   . Worried About Programme researcher, broadcasting/film/video in the Last Year: Not on file  . Ran Out of Food in the Last Year: Not on file  Transportation Needs:   . Lack of Transportation (Medical): Not on file  . Lack of  Transportation (Non-Medical): Not on file  Physical Activity:   . Days of Exercise per Week: Not on file  . Minutes of Exercise per Session: Not on file  Stress:   . Feeling of Stress : Not on file  Social Connections:   . Frequency of Communication with Friends and Family: Not on file  . Frequency of Social Gatherings with Friends and Family: Not on file  . Attends Religious Services: Not on file  . Active Member of Clubs or Organizations: Not on file  . Attends Banker Meetings: Not on file  . Marital Status: Not on file  Intimate Partner Violence:   . Fear of Current or Ex-Partner: Not on file  . Emotionally Abused: Not on file  . Physically Abused: Not on file  . Sexually Abused: Not on file    Outpatient Medications Prior to Visit  Medication Sig Dispense Refill  . fluticasone (FLONASE) 50 MCG/ACT nasal spray Place 1 spray into both nostrils daily.    Marland Kitchen loratadine (CLARITIN) 10 MG tablet Take 10 mg by mouth daily.     No facility-administered medications prior to visit.      ROS:  Review of Systems  Constitutional: Negative for fatigue, fever and unexpected weight change.  Respiratory: Negative for cough,  shortness of breath and wheezing.   Cardiovascular: Negative for chest pain, palpitations and leg swelling.  Gastrointestinal: Negative for blood in stool, constipation, diarrhea, nausea and vomiting.  Endocrine: Negative for cold intolerance, heat intolerance and polyuria.  Genitourinary: Negative for dyspareunia, dysuria, flank pain, frequency, genital sores, hematuria, menstrual problem, pelvic pain, urgency, vaginal bleeding, vaginal discharge and vaginal pain.  Musculoskeletal: Negative for back pain, joint swelling and myalgias.  Skin: Negative for rash.  Neurological: Negative for dizziness, syncope, light-headedness, numbness and headaches.  Hematological: Negative for adenopathy.  Psychiatric/Behavioral: Negative for agitation, confusion, sleep  disturbance and suicidal ideas. The patient is not nervous/anxious.   BREAST: tenderness   OBJECTIVE:   Vitals:  BP 100/80   Ht 5\' 5"  (1.651 m)   Wt 169 lb (76.7 kg)   LMP 05/31/2019 (Exact Date)   BMI 28.12 kg/m   Physical Exam Vitals reviewed.  Pulmonary:     Effort: Pulmonary effort is normal.  Chest:     Breasts: Breasts are symmetrical.        Right: Tenderness present. No inverted nipple, mass, nipple discharge or skin change.        Left: Tenderness present. No inverted nipple, mass, nipple discharge or skin change.     Comments: TENDERNESS BILAT OUTER QUADRANTS; NO MASSES Musculoskeletal:        General: Normal range of motion.     Cervical back: Normal range of motion.  Skin:    General: Skin is warm and dry.       Neurological:     General: No focal deficit present.     Mental Status: She is alert and oriented to person, place, and time.     Cranial Nerves: No cranial nerve deficit.  Psychiatric:        Mood and Affect: Mood normal.        Behavior: Behavior normal.        Thought Content: Thought content normal.        Judgment: Judgment normal.      Assessment/Plan: Bilateral mastodynia--tender outer quadrant bilat breasts. Sx for 2 yrs. Minimal to no caffeine use. Reassurance. Add Vit E 400 IUD BID. Check mammo due to [place] breast cancer.  Family history of breast cancer - Plan: MM 3D SCREEN BREAST BILATERAL; MyRisk testing discussed and done today. Will call with results. Scr mammo due to increased risk for breast cancer due to [place].  Encounter for screening mammogram for malignant neoplasm of breast - Plan: MM 3D SCREEN BREAST BILATERAL  Sebaceous cyst--LT upper back. Reassurance. F/u prn.     Return if symptoms worsen or fail to improve.  Nataya Bastedo B. Gabriella Guile, PA-C 06/25/2019 9:43 AM

## 2019-06-25 NOTE — Patient Instructions (Signed)
I value your feedback and entrusting us with your care. If you get a Charlotte Rodgers patient survey, I would appreciate you taking the time to let us know about your experience today. Thank you!  As of May 08, 2019, your lab results will be released to your MyChart immediately, before I even have a chance to see them. Please give me time to review them and contact you if there are any abnormalities. Thank you for your patience.  

## 2019-06-26 ENCOUNTER — Telehealth: Payer: Self-pay | Admitting: Obstetrics and Gynecology

## 2019-06-26 NOTE — Telephone Encounter (Signed)
Left message for pt to call office back in regards to her appt time and date at [facility]

## 2019-06-30 DIAGNOSIS — Z803 Family history of malignant neoplasm of breast: Secondary | ICD-10-CM

## 2019-06-30 DIAGNOSIS — Z1371 Encounter for nonprocreative screening for genetic disease carrier status: Secondary | ICD-10-CM

## 2019-06-30 HISTORY — DX: Encounter for nonprocreative screening for genetic disease carrier status: Z13.71

## 2019-06-30 HISTORY — DX: Family history of malignant neoplasm of breast: Z80.3

## 2019-07-01 NOTE — Telephone Encounter (Signed)
Patient is aware of location date and time. No lotions. Perfumes or Deoderant

## 2019-07-08 ENCOUNTER — Encounter: Payer: Self-pay | Admitting: Obstetrics and Gynecology

## 2019-07-16 ENCOUNTER — Telehealth: Payer: Self-pay | Admitting: Obstetrics and Gynecology

## 2019-07-16 NOTE — Telephone Encounter (Signed)
Pt aware of neg MyRisk results except RAD51D VUS. IBIS 13.3%. Cont monthly SBE, yearly CBE. Has baseline mammo next wk.  Patient understands these results only apply to her and her children, and this is not indicative of genetic testing results of her other family members. It is recommended that her other family members have genetic testing done.  Pt also understands negative genetic testing doesn't mean she will never get any of these cancers.   Hard copy mailed to pt. F/u prn.

## 2019-07-21 ENCOUNTER — Emergency Department
Admission: EM | Admit: 2019-07-21 | Discharge: 2019-07-21 | Disposition: A | Discharge status: Home / Self Care | Payer: Medicaid Other | Attending: Emergency Medicine | Admitting: Emergency Medicine

## 2019-07-21 ENCOUNTER — Other Ambulatory Visit: Payer: Self-pay

## 2019-07-21 DIAGNOSIS — Z20822 Contact with and (suspected) exposure to covid-19: Secondary | ICD-10-CM | POA: Diagnosis not present

## 2019-07-21 DIAGNOSIS — Z79899 Other long term (current) drug therapy: Secondary | ICD-10-CM | POA: Insufficient documentation

## 2019-07-21 DIAGNOSIS — B349 Viral infection, unspecified: Secondary | ICD-10-CM | POA: Insufficient documentation

## 2019-07-21 DIAGNOSIS — J029 Acute pharyngitis, unspecified: Secondary | ICD-10-CM | POA: Diagnosis present

## 2019-07-21 LAB — SARS CORONAVIRUS 2 (TAT 6-24 HRS): SARS Coronavirus 2: NEGATIVE

## 2019-07-21 LAB — GROUP A STREP BY PCR: Group A Strep by PCR: NOT DETECTED

## 2019-07-21 MED ORDER — IBUPROFEN 600 MG PO TABS: 600.0000 mg | ORAL_TABLET | Freq: Three times a day (TID) | ORAL | 0 refills | Status: DC | PRN

## 2019-07-21 MED ORDER — FEXOFENADINE-PSEUDOEPHED ER 60-120 MG PO TB12: 1.0000 | ORAL_TABLET | Freq: Two times a day (BID) | ORAL | 0 refills | Status: DC

## 2019-07-21 MED ORDER — LIDOCAINE VISCOUS HCL 2 % MT SOLN: 5.0000 mL | Freq: Four times a day (QID) | OROMUCOSAL | 0 refills | Status: DC | PRN

## 2019-07-21 NOTE — ED Provider Notes (Signed)
[facility] Emergency Department Provider Note   ____________________________________________   First MD Initiated Contact with Patient 07/21/19 1133     (approximate)  I have reviewed the triage vital signs and the nursing notes.   HISTORY  Chief Complaint Chills and Sore Throat    HPI Charlotte Rodgers is a 34 y.o. female patient presents with chills and sore throat.  Patient states complaint began last night.  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  Patient states she was informed that someone in her group has tested positive for COVID-19.  Patient denies recent travel.  Patient denies other URI signs and symptoms.  Patient denies nausea, vomiting, diarrhea.         Past Medical History:  Diagnosis Date  . BRCA negative 06/2019   MyRisk neg except RAD51D VUS  . Endometriosis   . Family history of breast cancer 06/2019   IBIS=13.3%    Patient Active Problem List   Diagnosis Date Noted  . Family history of breast cancer 06/25/2019    No past surgical history on file.  Prior to Admission medications   Medication Sig Start Date End Date Taking? Authorizing Provider  fexofenadine-pseudoephedrine (ALLEGRA-D) 60-120 MG 12 hr tablet Take 1 tablet by mouth 2 (two) times daily. 07/21/19   Sable Feil, PA-C  fluticasone (FLONASE) 50 MCG/ACT nasal spray Place 1 spray into both nostrils daily. 08/14/18   [provider]  ibuprofen (ADVIL) 600 MG tablet Take 1 tablet (600 mg total) by mouth every 8 (eight) hours as needed. 07/21/19   Sable Feil, PA-C  lidocaine (XYLOCAINE) 2 % solution Use as directed 5 mLs in the mouth or throat every 6 (six) hours as needed for mouth pain. Oral swish and swallow. 07/21/19   Sable Feil, PA-C  loratadine (CLARITIN) 10 MG tablet Take 10 mg by mouth daily. 08/14/18   [provider]    Allergies Patient has no known allergies.  Family History  Problem Relation Age of Onset  . Breast cancer  Maternal Grandmother 32  . Ovarian cancer Maternal Grandmother   . Breast cancer Cousin 65  . Breast cancer Other 41  . Breast cancer Maternal Great-grandmother 14    Social History Social History   Tobacco Use  . Smoking status: Never Smoker  . Smokeless tobacco: Never Used  Substance Use Topics  . Alcohol use: No  . Drug use: Never    Review of Systems Constitutional: No fever but chills. Eyes: No visual changes. ENT: sore throat. Cardiovascular: Denies chest pain. Respiratory: Denies shortness of breath. Gastrointestinal: No abdominal pain.  No nausea, no vomiting.  No diarrhea.  No constipation. Genitourinary: Negative for dysuria. Musculoskeletal: Negative for back pain. Skin: Negative for rash. Neurological: Negative for headaches, focal weakness or numbness.   ____________________________________________   PHYSICAL EXAM:  VITAL SIGNS: ED Triage Vitals  Enc Vitals Group     BP 07/21/19 1116 127/75     Pulse Rate 07/21/19 1116 87     Resp 07/21/19 1116 18     Temp 07/21/19 1116 97.8 F (36.6 C)     Temp Source 07/21/19 1116 Oral     SpO2 07/21/19 1116 97 %     Weight 07/21/19 1117 160 lb (72.6 kg)     Height 07/21/19 1117 5' 5" (1.651 m)     Head Circumference --      Peak Flow --      Pain Score 07/21/19 1116 10  Pain Loc --      Pain Edu? --      Excl. in [place]? --    Constitutional: Alert and oriented. Well appearing and in no acute distress. Eyes: Conjunctivae are normal. PERRL. EOMI. Head: Atraumatic. Nose: No congestion/rhinnorhea. Mouth/Throat: Mucous membranes are moist.  Oropharynx erythematous. Neck: No stridor.  Hematological/Lymphatic/Immunilogical: No cervical lymphadenopathy. Cardiovascular: Normal rate, regular rhythm. Grossly normal heart sounds.  Good peripheral circulation. Respiratory: Normal respiratory effort.  No retractions. Lungs CTAB. Skin:  Skin is warm, dry and intact. No rash noted. Psychiatric: Mood and affect are  normal. Speech and behavior are normal.  ____________________________________________   LABS (all labs ordered are listed, but only abnormal results are displayed)  Labs Reviewed  GROUP A STREP BY PCR  SARS CORONAVIRUS 2 (TAT 6-24 HRS)   ____________________________________________  EKG   ____________________________________________  RADIOLOGY  ED MD interpretation:    Official radiology report(s): No results found.  ____________________________________________   PROCEDURES  Procedure(s) performed (including Critical Care):  Procedures   ____________________________________________   INITIAL IMPRESSION / ASSESSMENT AND PLAN / ED COURSE  As part of my medical decision making, I reviewed the following data within the [place]     Patient presents with chills, sore throat, and nasal congestion.  Patient states contact with COVID-19.  Discussed negative strep results with patient.  Patient given discharge care instruction advised self quarantine pending results of COVID-19 test.  Patient may follow-up results in the MyChart app.  Patient advised she will be notified telephonically if test is positive.  Advised to follow-up PCP.    Charlotte Rodgers was evaluated in Emergency Department on 07/21/2019 for the symptoms described in the history of present illness. She was evaluated in the context of the global COVID-19 pandemic, which necessitated consideration that the patient might be at risk for infection with the SARS-CoV-2 virus that causes COVID-19. Institutional protocols and algorithms that pertain to the evaluation of patients at risk for COVID-19 are in a state of rapid change based on information released by regulatory bodies including the CDC and federal and state organizations. These policies and algorithms were followed during the patient's care in the ED.       ____________________________________________   FINAL CLINICAL IMPRESSION(S) / ED  DIAGNOSES  Final diagnoses:  Viral illness     ED Discharge Orders         Ordered    lidocaine (XYLOCAINE) 2 % solution  Every 6 hours PRN     07/21/19 1343    fexofenadine-pseudoephedrine (ALLEGRA-D) 60-120 MG 12 hr tablet  2 times daily     07/21/19 1343    ibuprofen (ADVIL) 600 MG tablet  Every 8 hours PRN     07/21/19 1343           Note:  This document was prepared using Dragon voice recognition software and may include unintentional dictation errors.    Sable Feil, PA-C 07/21/19 1347    Delman Kitten, MD 07/23/19 [phone]

## 2019-07-21 NOTE — ED Triage Notes (Signed)
Pt here with c/o chills and mild sore throat that began last night. NAD.

## 2019-07-21 NOTE — Discharge Instructions (Signed)
Follow discharge care instruction take medication as directed.  Advised self quarantine pending results of COVID-19 test. 

## 2019-07-21 NOTE — ED Notes (Signed)
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 Reports being weak and fatigued as well. When asked, stated did has fever last night of 100.4 that she took tylenol for. Denies changes in breathing. Currently in NAD; sitting calmly in bed. Clear lungs sounds on auscultation.

## 2019-10-08 ENCOUNTER — Ambulatory Visit: Payer: Medicaid Other | Admitting: Obstetrics and Gynecology

## 2019-10-08 ENCOUNTER — Encounter: Payer: Self-pay | Admitting: Obstetrics and Gynecology

## 2019-10-08 ENCOUNTER — Other Ambulatory Visit (HOSPITAL_COMMUNITY)
Admission: RE | Admit: 2019-10-08 | Discharge: 2019-10-08 | Disposition: A | Discharge status: Home / Self Care | Payer: Medicaid Other | Source: Ambulatory Visit | Attending: Obstetrics and Gynecology | Admitting: Obstetrics and Gynecology

## 2019-10-08 ENCOUNTER — Other Ambulatory Visit: Payer: Self-pay

## 2019-10-08 VITALS — BP 100/60 | Ht 65.0 in | Wt 169.0 lb

## 2019-10-08 DIAGNOSIS — N809 Endometriosis, unspecified: Secondary | ICD-10-CM | POA: Insufficient documentation

## 2019-10-08 DIAGNOSIS — N898 Other specified noninflammatory disorders of vagina: Secondary | ICD-10-CM | POA: Diagnosis not present

## 2019-10-08 DIAGNOSIS — Z113 Encounter for screening for infections with a predominantly sexual mode of transmission: Secondary | ICD-10-CM

## 2019-10-08 DIAGNOSIS — N946 Dysmenorrhea, unspecified: Secondary | ICD-10-CM

## 2019-10-08 LAB — POCT WET PREP WITH KOH
Clue Cells Wet Prep HPF POC: NEGATIVE
KOH Prep POC: NEGATIVE
Trichomonas, UA: NEGATIVE
Yeast Wet Prep HPF POC: NEGATIVE

## 2019-10-08 MED ORDER — NAPROXEN SODIUM 550 MG PO TABS: 550.0000 mg | ORAL_TABLET | Freq: Two times a day (BID) | ORAL | 2 refills | Status: AC | PRN

## 2019-10-08 MED ORDER — FLUCONAZOLE 150 MG PO TABS: 150.0000 mg | ORAL_TABLET | Freq: Once | ORAL | 0 refills | Status: AC

## 2019-10-08 NOTE — Patient Instructions (Signed)
I value your feedback and entrusting us with your care. If you get a [facility] patient survey, I would appreciate you taking the time to let us know about your experience today. Thank you!  As of May 08, 2019, your lab results will be released to your MyChart immediately, before I even have a chance to see them. Please give me time to review them and contact you if there are any abnormalities. Thank you for your patience.  

## 2019-10-08 NOTE — Progress Notes (Signed)
Patient, No Pcp Per   Chief Complaint  Patient presents with  . Vaginal Discharge    itchiness, irritation, no odor x 2 weeks  . STD testing    HPI:      Charlotte Rodgers is a 34 y.o. [phone] whose LMP was Patient's last menstrual period was 10/01/2019 (exact date)., presents today for increased d/c with irritaiton, no fishy odor, for the past 2 wks. No meds to treat, no prior abx use. No new soaps/detergents. Uses dove sens skin soap, no dryer sheets. Hx of BV in past. No urin sx, no LBP, pelvic pain, fevers.   She is not sex active, would like STD testing to be safe. No known exposures. Neg STD testing 8/20.  She has a hx of endometriosis, confirmed and treated with dx lap in 2013. Feels like it's coming back due to dysmen mid cycle and with menses. Doesn't want to go to work due to pain. Did OCPs for short term after surgery, then got pregnant. No tx since. Not interested in hormonal tx. No dyspareunia.  Annual due 8/21.  Past Medical History:  Diagnosis Date  . BRCA negative 06/2019   MyRisk neg except RAD51D VUS  . Endometriosis   . Family history of breast cancer 06/2019   IBIS=13.3%    History reviewed. No pertinent surgical history.  Family History  Problem Relation Age of Onset  . Breast cancer Maternal Grandmother 12  . Ovarian cancer Maternal Grandmother   . Breast cancer Cousin 67  . Breast cancer Other 41  . Breast cancer Maternal Great-grandmother 14    Social History   Socioeconomic History  . Marital status: Single    Spouse name: Not on file  . Number of children: Not on file  . Years of education: Not on file  . Highest education level: Not on file  Occupational History  . Not on file  Tobacco Use  . Smoking status: Never Smoker  . Smokeless tobacco: Never Used  Substance and Sexual Activity  . Alcohol use: No  . Drug use: Never  . Sexual activity: Not Currently    Birth control/protection: None  Other Topics Concern  . Not on file    Social History Narrative  . Not on file   Social Determinants of Health   Financial Resource Strain:   . Difficulty of Paying Living Expenses:   Food Insecurity:   . Worried About Charity fundraiser in the Last Year:   . Arboriculturist in the Last Year:   Transportation Needs:   . Film/video editor (Medical):   Marland Kitchen Lack of Transportation (Non-Medical):   Physical Activity:   . Days of Exercise per Week:   . Minutes of Exercise per Session:   Stress:   . Feeling of Stress :   Social Connections:   . Frequency of Communication with Friends and Family:   . Frequency of Social Gatherings with Friends and Family:   . Attends Religious Services:   . Active Member of Clubs or Organizations:   . Attends Archivist Meetings:   Marland Kitchen Marital Status:   Intimate Partner Violence:   . Fear of Current or Ex-Partner:   . Emotionally Abused:   Marland Kitchen Physically Abused:   . Sexually Abused:     Outpatient Medications Prior to Visit  Medication Sig Dispense Refill  . fexofenadine-pseudoephedrine (ALLEGRA-D) 60-120 MG 12 hr tablet Take 1 tablet by mouth 2 (two) times daily. 20 tablet  0  . fluticasone (FLONASE) 50 MCG/ACT nasal spray Place 1 spray into both nostrils daily.    Marland Kitchen ibuprofen (ADVIL) 600 MG tablet Take 1 tablet (600 mg total) by mouth every 8 (eight) hours as needed. 15 tablet 0  . lidocaine (XYLOCAINE) 2 % solution Use as directed 5 mLs in the mouth or throat every 6 (six) hours as needed for mouth pain. Oral swish and swallow. 100 mL 0  . loratadine (CLARITIN) 10 MG tablet Take 10 mg by mouth daily.     No facility-administered medications prior to visit.      ROS:  Review of Systems  Constitutional: Negative for fever.  Gastrointestinal: Negative for blood in stool, constipation, diarrhea, nausea and vomiting.  Genitourinary: Positive for pelvic pain and vaginal discharge. Negative for dyspareunia, dysuria, flank pain, frequency, hematuria, urgency, vaginal  bleeding and vaginal pain.  Musculoskeletal: Negative for back pain.  Skin: Negative for rash.    OBJECTIVE:   Vitals:  BP 100/60   Ht '5\' 5"'$  (1.651 m)   Wt 169 lb (76.7 kg)   LMP 10/01/2019 (Exact Date)   BMI 28.12 kg/m   Physical Exam Vitals reviewed.  Constitutional:      Appearance: She is well-developed.  Pulmonary:     Effort: Pulmonary effort is normal.  Genitourinary:    General: Normal vulva.     Pubic Area: No rash.      Labia:        Right: No rash, tenderness or lesion.        Left: No rash, tenderness or lesion.      Vagina: Normal. No vaginal discharge, erythema or tenderness.     Cervix: Normal.     Uterus: Normal. Not enlarged and not tender.      Adnexa: Right adnexa normal and left adnexa normal.       Right: No mass or tenderness.         Left: No mass or tenderness.    Musculoskeletal:        General: Normal range of motion.     Cervical back: Normal range of motion.  Skin:    General: Skin is warm and dry.  Neurological:     General: No focal deficit present.     Mental Status: She is alert and oriented to person, place, and time.  Psychiatric:        Mood and Affect: Mood normal.        Behavior: Behavior normal.        Thought Content: Thought content normal.        Judgment: Judgment normal.     Results: Results for orders placed or performed in visit on 10/08/19 (from the past 24 hour(s))  POCT Wet Prep with KOH     Status: Normal   Collection Time: 10/08/19  3:47 PM  Result Value Ref Range   Trichomonas, UA Negative    Clue Cells Wet Prep HPF POC neg    Epithelial Wet Prep HPF POC     Yeast Wet Prep HPF POC neg    Bacteria Wet Prep HPF POC     RBC Wet Prep HPF POC     WBC Wet Prep HPF POC     KOH Prep POC Negative Negative     Assessment/Plan: Vaginal itching - Plan: fluconazole (DIFLUCAN) 150 MG tablet, POCT Wet Prep with KOH; Neg wet prep/exam. Treat empirically with diflucan. F/u prn. Rule out STD.  Screening for STD  (sexually transmitted disease) -  Plan: Cervicovaginal ancillary only  Dysmenorrhea - Plan: naproxen sodium (ANAPROX DS) 550 MG tablet; Pt declines hormonal tx/hyst for endometriosis. Try Rx anaprox. F/u prn.   Endometriosis--tx options discussed.    Meds ordered this encounter  Medications  . fluconazole (DIFLUCAN) 150 MG tablet    Sig: Take 1 tablet (150 mg total) by mouth once for 1 dose.    Dispense:  1 tablet    Refill:  0    Order Specific Question:   Supervising Provider    Answer:   Gae Dry [id number]  . naproxen sodium (ANAPROX DS) 550 MG tablet    Sig: Take 1 tablet (550 mg total) by mouth 2 (two) times daily as needed.    Dispense:  30 tablet    Refill:  2    Order Specific Question:   Supervising Provider    Answer:   Gae Dry [[id number]]      Return if symptoms worsen or fail to improve.  Jaston Havens B. Danajah Birdsell, PA-C 10/08/2019 3:49 PM

## 2019-10-10 LAB — CERVICOVAGINAL ANCILLARY ONLY
Chlamydia: NEGATIVE
Comment: NEGATIVE
Comment: NORMAL
Neisseria Gonorrhea: NEGATIVE

## 2019-10-13 ENCOUNTER — Telehealth: Payer: Self-pay

## 2019-10-13 ENCOUNTER — Other Ambulatory Visit: Payer: Self-pay | Admitting: Obstetrics and Gynecology

## 2019-10-13 DIAGNOSIS — N946 Dysmenorrhea, unspecified: Secondary | ICD-10-CM

## 2019-10-13 MED ORDER — NAPROXEN 500 MG PO TABS: 500.0000 mg | ORAL_TABLET | Freq: Two times a day (BID) | ORAL | 1 refills | Status: AC

## 2019-10-13 NOTE — Telephone Encounter (Signed)
Rx changed. Pls notify pt. Thx.

## 2019-10-13 NOTE — Telephone Encounter (Signed)
Received PA for Rx naproxen sodium 550MG . Called pharmacy to start PA and was advised plan covers naproxen sodium 500 mg. Can Rx be sent for this strength instead?

## 2019-10-13 NOTE — Progress Notes (Signed)
Rx change to naprosyn from Anaprox DS due to ins coverage. F/u prn.

## 2019-10-13 NOTE — Telephone Encounter (Signed)
Pt aware.

## 2020-06-02 ENCOUNTER — Ambulatory Visit (INDEPENDENT_AMBULATORY_CARE_PROVIDER_SITE_OTHER): Payer: Medicaid Other | Admitting: Obstetrics

## 2020-06-02 ENCOUNTER — Encounter: Payer: Self-pay | Admitting: Obstetrics

## 2020-06-02 ENCOUNTER — Other Ambulatory Visit: Payer: Self-pay

## 2020-06-02 ENCOUNTER — Other Ambulatory Visit (HOSPITAL_COMMUNITY)
Admission: RE | Admit: 2020-06-02 | Discharge: 2020-06-02 | Disposition: A | Discharge status: Home / Self Care | Payer: Medicaid Other | Source: Ambulatory Visit | Attending: Obstetrics | Admitting: Obstetrics

## 2020-06-02 VITALS — BP 120/72 | Ht 65.0 in | Wt 172.0 lb

## 2020-06-02 DIAGNOSIS — Z3A10 10 weeks gestation of pregnancy: Secondary | ICD-10-CM

## 2020-06-02 DIAGNOSIS — Z113 Encounter for screening for infections with a predominantly sexual mode of transmission: Secondary | ICD-10-CM | POA: Diagnosis present

## 2020-06-02 DIAGNOSIS — Z01419 Encounter for gynecological examination (general) (routine) without abnormal findings: Secondary | ICD-10-CM

## 2020-06-02 DIAGNOSIS — Z3201 Encounter for pregnancy test, result positive: Secondary | ICD-10-CM

## 2020-06-02 DIAGNOSIS — Z124 Encounter for screening for malignant neoplasm of cervix: Secondary | ICD-10-CM

## 2020-06-02 DIAGNOSIS — O09521 Supervision of elderly multigravida, first trimester: Secondary | ICD-10-CM

## 2020-06-02 NOTE — Progress Notes (Signed)
Gynecology Annual Exam  PCP: Patient, No Pcp Per  Chief Complaint:  Chief Complaint  Patient presents with  . Gynecologic Exam    Annual Exam and STI testing     History of Present Illness Ms. Pocahontas Cohenour is a 35 y.o. [phone] who LMP was Patient's last menstrual period was 04/26/2020., present today for an annual GYN physical. She is requesting a pregnancy test.She is not contracepting. She has a hx of irregular periods and 'didn't think at her age she could get pregnant" She does not have vasomotor sx. She uses no meds.  She is single partner, contraception - none. She does not have vaginal dryness.  Last Pap: January 04, 2019  Results were: no abnormalities /neg HPV DNA.  Hx of STDs: HSV   There isa FH of breast cancer. Her maternal grandmother had breast CA. There is no FH of ovarian cancer. The patient does do self-breast exams.  Colonoscopy: not yet due DEXA: not yet due  Tobacco use: The patient denies current or previous tobacco use. Alcohol use: none Exercise: not active  She does not get adequate calcium and Vitamin D in her diet. She takes a daily multivitamin. Is Islamic and ears a full headress.   The patient is sexually active. She currently uses None for contraception. The patient wears seatbelts: yes.   last pap: was normal  The patient has regular exercise: no.  The patient has ever been transfused or tattooed?: no.  The patient reports that domestic violence in her life is absent.    Review of Systems: Review of Systems  Constitutional: Negative.   HENT: Negative.   Eyes: Negative.   Cardiovascular: Negative.   Gastrointestinal: Negative.   Genitourinary: Negative.   Musculoskeletal: Negative.   Skin: Negative.   Neurological: Negative.   Endo/Heme/Allergies: Negative.   Psychiatric/Behavioral: Negative.      Past Medical History:  Past Medical History:  Diagnosis Date  . BRCA negative 06/2019   MyRisk neg except RAD51D VUS  . Endometriosis   .  Family history of breast cancer 06/2019   IBIS=13.3%    Past Surgical History:  No past surgical history on file.  Medications: Prior to Admission medications   Medication Sig Start Date End Date Taking? Authorizing Provider  naproxen (NAPROSYN) 500 MG tablet Take 1 tablet (500 mg total) by mouth 2 (two) times daily with a meal. As needed for pain Patient not taking: Reported on 06/02/2020 08/10/95   Copland, Elmo Putt B, PA-C  naproxen sodium (ANAPROX DS) 550 MG tablet Take 1 tablet (550 mg total) by mouth 2 (two) times daily as needed. Patient not taking: Reported on 06/02/2020 0/26/37   Copland, Deirdre Evener, PA-C    Allergies:  No Known Allergies  Gynecologic History: Patient's last menstrual period was 04/26/2020.  Obstetric History: [id number]  Social History:  Social History   Socioeconomic History  . Marital status: Single    Spouse name: Not on file  . Number of children: Not on file  . Years of education: Not on file  . Highest education level: Not on file  Occupational History  . Not on file  Tobacco Use  . Smoking status: Never Smoker  . Smokeless tobacco: Never Used  Vaping Use  . Vaping Use: Never used  Substance and Sexual Activity  . Alcohol use: No  . Drug use: Never  . Sexual activity: Not Currently    Birth control/protection: None  Other Topics Concern  . Not on file  Social History  Narrative  . Not on file   Social Determinants of Health   Financial Resource Strain: Not on file  Food Insecurity: Not on file  Transportation Needs: Not on file  Physical Activity: Not on file  Stress: Not on file  Social Connections: Not on file  Intimate Partner Violence: Not on file    Family History:  Family History  Problem Relation Age of Onset  . Breast cancer Maternal Grandmother 64  . Ovarian cancer Maternal Grandmother   . Breast cancer Cousin 54  . Breast cancer Other 41  . Breast cancer Maternal Great-grandmother 34     Physical Exam Vitals:   Vitals:   06/02/20 0858  BP: 120/72   Physical Exam Constitutional:      Appearance: Normal appearance.  HENT:     Head: Normocephalic and atraumatic.     Nose: Nose normal.  Eyes:     Extraocular Movements: Extraocular movements intact.     Pupils: Pupils are equal, round, and reactive to light.  Cardiovascular:     Rate and Rhythm: Normal rate and regular rhythm.     Pulses: Normal pulses.     Heart sounds: Normal heart sounds.  Pulmonary:     Effort: Pulmonary effort is normal.     Breath sounds: Normal breath sounds.  Abdominal:     General: Bowel sounds are normal.     Palpations: Abdomen is soft.  Genitourinary:    General: Normal vulva.     Comments: Normal female genitalia. No lesions or rahses or irritation noted. Normal vaginal mucose. No enlarged Skene's or Bartholin glands. Scant white vaginal discharge.  Musculoskeletal:        General: Normal range of motion.     Cervical back: Normal range of motion and neck supple.  Skin:    General: Skin is warm and dry.  Neurological:     General: No focal deficit present.     Mental Status: She is alert and oriented to person, place, and time.  Psychiatric:        Mood and Affect: Mood normal.      Assessment: 35 y.o. [id number] well woman exam  Plan:  1)Pateint has positive pregnancy test today.We will set her up for a dating scan, NOB appointment.  2) STI screening was offered done for GC/CT. Her blood work will be done at her NOB.  3) Pap done  4) Routine healthcare maintenance including cholesterol, diabetes screening Handled by her PCP.  5) Follow up for her NOB  exam  1. Women's annual routine gynecological examination   2. Cervical cancer screening done  3. Routine screening for STI (sexually transmitted infection) done    Imagene Riches, CNM  06/02/2020 9:06 AM

## 2020-06-02 NOTE — Progress Notes (Signed)
Annual Exam and STI

## 2020-06-03 ENCOUNTER — Encounter: Payer: Self-pay | Admitting: Obstetrics

## 2020-06-03 DIAGNOSIS — Z3480 Encounter for supervision of other normal pregnancy, unspecified trimester: Secondary | ICD-10-CM | POA: Insufficient documentation

## 2020-06-03 LAB — HEP, RPR, HIV PANEL
HIV Screen 4th Generation wRfx: NONREACTIVE
Hepatitis B Surface Ag: NEGATIVE
RPR Ser Ql: NONREACTIVE

## 2020-06-04 ENCOUNTER — Ambulatory Visit: Payer: Self-pay | Admitting: Obstetrics and Gynecology

## 2020-06-07 LAB — CYTOLOGY - PAP
Chlamydia: NEGATIVE
Comment: NEGATIVE
Comment: NEGATIVE
Comment: NEGATIVE
Comment: NORMAL
Diagnosis: NEGATIVE
High risk HPV: NEGATIVE
Neisseria Gonorrhea: NEGATIVE
Trichomonas: NEGATIVE

## 2020-06-19 ENCOUNTER — Emergency Department
Admission: EM | Admit: 2020-06-19 | Discharge: 2020-06-19 | Disposition: A | Discharge status: Other Acute Inpatient Hospital | Payer: Medicaid Other

## 2020-07-01 ENCOUNTER — Other Ambulatory Visit: Payer: Self-pay

## 2020-07-01 ENCOUNTER — Ambulatory Visit (INDEPENDENT_AMBULATORY_CARE_PROVIDER_SITE_OTHER): Payer: Medicaid Other

## 2020-07-01 ENCOUNTER — Ambulatory Visit (INDEPENDENT_AMBULATORY_CARE_PROVIDER_SITE_OTHER): Payer: Medicaid Other | Admitting: Obstetrics and Gynecology

## 2020-07-01 ENCOUNTER — Encounter: Payer: Self-pay | Admitting: Obstetrics and Gynecology

## 2020-07-01 VITALS — BP 110/70 | Ht 65.0 in | Wt 174.0 lb

## 2020-07-01 DIAGNOSIS — Z3A10 10 weeks gestation of pregnancy: Secondary | ICD-10-CM | POA: Diagnosis not present

## 2020-07-01 DIAGNOSIS — O219 Vomiting of pregnancy, unspecified: Secondary | ICD-10-CM | POA: Diagnosis not present

## 2020-07-01 DIAGNOSIS — Z3490 Encounter for supervision of normal pregnancy, unspecified, unspecified trimester: Secondary | ICD-10-CM | POA: Diagnosis not present

## 2020-07-01 DIAGNOSIS — O09521 Supervision of elderly multigravida, first trimester: Secondary | ICD-10-CM | POA: Diagnosis not present

## 2020-07-01 MED ORDER — ONDANSETRON 4 MG PO TBDP: 4.0000 mg | ORAL_TABLET | Freq: Four times a day (QID) | ORAL | 0 refills | Status: AC | PRN

## 2020-07-01 NOTE — Progress Notes (Signed)
Patient ID: Charlotte Rodgers, female   DOB: May 22, 1986, 35 y.o.   MRN: [id number]  Reason for Consult:  Pregnancy unknown location  Referred by No ref. provider found  Subjective:     HPI:  Charlotte Rodgers is a 35 y.o. female. She is here today for a dating [place] and pregnancy follow up. [place] today does not show a yolk sac or a fetal pole. She reports she has been having less breast tenderness. She is having nausea and vomiting. She reports that she has been nausea and daily vomiting 2-3 times. She feels like she has lost weight although she is not sure how much.   She is concerned regarding the possibility of a miscarriage or blighted ovum. Patient's last menstrual period was 04/26/2020.  She has had a miscarriage in 2020 treated with cytotec and a prior termination treated by a D&C several years ago.  She has not been having vaginal bleeding. She is not having cramping. This is a desired pregnancy. This was her first [place].    Past Medical History:  Diagnosis Date  . BRCA negative 06/2019   MyRisk neg except RAD51D VUS  . Endometriosis   . Family history of breast cancer 06/2019   IBIS=13.3%   Family History  Problem Relation Age of Onset  . Breast cancer Maternal Grandmother 62  . Ovarian cancer Maternal Grandmother   . Breast cancer Cousin 11  . Breast cancer Other 41  . Breast cancer Maternal Great-grandmother 79   History reviewed. No pertinent surgical history.  Short Social History:  Social History   Tobacco Use  . Smoking status: Never Smoker  . Smokeless tobacco: Never Used  Substance Use Topics  . Alcohol use: No    No Known Allergies  Current Outpatient Medications  Medication Sig Dispense Refill  . ondansetron (ZOFRAN ODT) 4 MG disintegrating tablet Take 1 tablet (4 mg total) by mouth every 6 (six) hours as needed for nausea. 20 tablet 0  . naproxen (NAPROSYN) 500 MG tablet Take 1 tablet (500 mg total) by mouth 2 (two) times daily with a meal. As needed for pain (Patient  not taking: Reported on 06/02/2020) 30 tablet 1  . naproxen sodium (ANAPROX DS) 550 MG tablet Take 1 tablet (550 mg total) by mouth 2 (two) times daily as needed. (Patient not taking: Reported on 06/02/2020) 30 tablet 2   No current facility-administered medications for this visit.    Review of Systems  Constitutional: Negative for chills, fatigue, fever and unexpected weight change.  HENT: Negative for trouble swallowing.  Eyes: Negative for loss of vision.  Respiratory: Negative for cough, shortness of breath and wheezing.  Cardiovascular: Negative for chest pain, leg swelling, palpitations and syncope.  GI: Positive for nausea and vomiting. Negative for abdominal pain, blood in stool and diarrhea.  GU: Negative for difficulty urinating, dysuria, frequency and hematuria.  Musculoskeletal: Negative for back pain, leg pain and joint pain.  Skin: Negative for rash.  Neurological: Negative for dizziness, headaches, light-headedness, numbness and seizures.  Psychiatric: Negative for behavioral problem, confusion, depressed mood and sleep disturbance.        Objective:  Objective   Vitals:   07/01/20 0902  BP: 110/70  Weight: 174 lb (78.9 kg)  Height: $Remove'5\' 5"'cdzUofO$  (1.651 m)   Body mass index is 28.96 kg/m.  Physical Exam Vitals and nursing note reviewed.  Constitutional:      Appearance: She is well-developed and well-nourished.  HENT:     Head: Normocephalic and  atraumatic.  Eyes:     Extraocular Movements: EOM normal.     Pupils: Pupils are equal, round, and reactive to light.  Cardiovascular:     Rate and Rhythm: Normal rate and regular rhythm.  Pulmonary:     Effort: Pulmonary effort is normal. No respiratory distress.  Skin:    General: Skin is warm and dry.  Neurological:     Mental Status: She is alert and oriented to person, place, and time.  Psychiatric:        Mood and Affect: Mood and affect normal.        Behavior: Behavior normal.        Thought Content: Thought  content normal.        Judgment: Judgment normal.     Assessment/Plan:     35 yo with early stage of pregnancy.  Korea suggestive of possible miscarriage because of mean sac diameter, but patient is not symptomatic of miscarriage at this time. Will repeat US in 1 week.  Patient desires beta hcg levels to be obtained.                                                                              Given zofran for nausea/vomiting A+ blood type  Plan to follow up in 1 week.   More than 20 minutes were spent face to face with the patient in the room, reviewing the medical record, labs and images, and coordinating care for the patient. The plan of management was discussed in detail and counseling was provided.   Adrian Prows MD [facility], [facility] 07/01/2020 11:32 AM

## 2020-07-02 LAB — BETA HCG QUANT (REF LAB): hCG Quant: 96272 m[IU]/mL

## 2020-07-08 ENCOUNTER — Other Ambulatory Visit: Payer: Self-pay

## 2020-07-08 ENCOUNTER — Ambulatory Visit (INDEPENDENT_AMBULATORY_CARE_PROVIDER_SITE_OTHER): Payer: Medicaid Other | Admitting: Obstetrics & Gynecology

## 2020-07-08 ENCOUNTER — Ambulatory Visit (INDEPENDENT_AMBULATORY_CARE_PROVIDER_SITE_OTHER): Payer: Medicaid Other

## 2020-07-08 ENCOUNTER — Other Ambulatory Visit: Payer: Self-pay | Admitting: Obstetrics and Gynecology

## 2020-07-08 ENCOUNTER — Encounter: Payer: Self-pay | Admitting: Obstetrics & Gynecology

## 2020-07-08 VITALS — BP 100/68 | Wt 168.0 lb

## 2020-07-08 DIAGNOSIS — O020 Blighted ovum and nonhydatidiform mole: Secondary | ICD-10-CM | POA: Diagnosis not present

## 2020-07-08 DIAGNOSIS — Z3490 Encounter for supervision of normal pregnancy, unspecified, unspecified trimester: Secondary | ICD-10-CM | POA: Diagnosis not present

## 2020-07-08 DIAGNOSIS — O021 Missed abortion: Secondary | ICD-10-CM

## 2020-07-08 MED ORDER — MISOPROSTOL 200 MCG PO TABS: ORAL_TABLET | ORAL | 0 refills | Status: AC

## 2020-07-08 NOTE — Progress Notes (Signed)
Called patient, discussed blighted ovum- sent rx for cytotec to pharmacy.  Discussed bleeding precautions. Advised not to take this medication at home alone in case of an emergency.   Sent her a message through [facility] with written instructions.  She will follow up in 2 weeks in the office.   Adelene Idler MD, [facility], [facility] 07/08/2020 10:30 PM

## 2020-07-08 NOTE — Progress Notes (Signed)
HPI: Pt is a 35 yo [phone] F with Korea follow up as she has early pregnancy w blighted ovum suggested by Korea last week.  Breast T and nausea still present and mild; no VB or pain.  Ultrasound demonstrates slight increase in gest sac diameter, with absent fetal pole or YS.  PMHx: She  has a past medical history of BRCA negative (06/2019), Endometriosis, and Family history of breast cancer (06/2019). Also,  has no past surgical history on file., family history includes Breast cancer (age of onset: 80) in her cousin; Breast cancer (age of onset: 40) in an other family member; Breast cancer (age of onset: 30) in her maternal great-grandmother; Breast cancer (age of onset: 32) in her maternal grandmother; Ovarian cancer in her maternal grandmother.,  reports that she has never smoked. She has never used smokeless tobacco. She reports that she does not drink alcohol and does not use drugs.  She has a current medication list which includes the following prescription(s): naproxen, naproxen sodium, and ondansetron. Also, has No Known Allergies.  Review of Systems  All other systems reviewed and are negative.   Objective: BP 100/68   Wt 168 lb (76.2 kg)   LMP 04/26/2020   BMI 27.96 kg/m   Physical examination Constitutional NAD, Conversant  Skin No rashes, lesions or ulceration.   Extremities: Moves all appropriately.  Normal ROM for age. No lymphadenopathy.  Neuro: Grossly intact  Psych: Oriented to PPT.  Normal mood. Normal affect.   US OB Transvaginal  Result Date: 07/08/2020 Patient Name: Zairah Arista DOB: [birth year] MRN: [id number] ULTRASOUND REPORT Location: [facility] Date of Service: 07/08/2020 Indications:Viability Findings: There is a single gestational sac seen within the uterus measuring 33.0 mm. No fetal pole, heartbeat, or yolk sac is seen. Right Ovary is normal in appearance. Left Ovary is normal appearance. Corpus luteal cyst:  is not visualized Survey of the adnexa demonstrates  no adnexal masses. There is no free peritoneal fluid in the cul de sac. Impression: 1. There is a blighted ovum seen within the uterus. Recommendations: 1.Clinical correlation with the patient's History and Physical Exam. Gweneth Dimitri, RT Review of ULTRASOUND.    I have personally reviewed images and report of recent ultrasound done at [facility].    Plan of management to be discussed with patient. Barnett Applebaum, MD, [facility], [facility] 07/08/2020  11:22 AM    Assessment:  Blighted ovum         Condolences were offered to the patient and her family.  I stressed that while emotionally difficult, that this did not occur because of an actions or inactions by the patient.  Somewhere between 10-20% of identified first trimester pregnancies will unfortunately end in miscarriage.  Given this relatively high incidence rate, further diagnostic testing such as chromosome analysis is generally not clinically relevant nor recommended.  Although the chromosomal abnormalities have been implicated at rates as high as 70% in some studies, these are generally random and do not infer and increased risk of recurrence with subsequent pregnancies.  However, 3 or more consecutive first trimester losses are relatively uncommon, and these patient generally do benefit from additional work up to determine a potential modifiable etiology.              We briefly discussed management options including expectant management, medical management, and surgical management as well as their relative success rates and complications. Approximately 80% of first trimester miscarriages will pass successfully but may require a  time frame of up to 8 weeks (ACOG Practice Bulletin 150 May 2015 "Early Pregnancy Loss").  Medical management has literature supporting its use up to 63 days or [redacted]w[redacted]d gestation and results in a passage rate of 84-85% (ACOG Practice Bulletin 143 March 2014 "Medical Management of First-Trimester  Abortion").  Dilation and curettage has the highest rate of uterine evacuation, but carries with is operative cost, surgical and anesthetic risk.  While these risk are relatively small they nevertheless include infection, bleeding, uterine perforation, formation of uterine synechia, and in rare cases death.              We discussed repeat ultrasound and or trending HCG levels if the patient wishes to pursue these prior to making her decision.  Clinically I am confident of the diagnosis, but I do not want any doubts in the patient's mind regarding the plan of management she chooses to adopt. Ample time was given to answer all questions.     Pt indicates she may prefer medical over surgical therapy. Pt also indicates preference for female provider.  As she has seen Dr Schuman in past, will discuss case with her and reach out to pt w a plan of management.  A total of 20 minutes were spent face-to-face with the patient as well as preparation, review, communication, and documentation during this encounter.   Paul Harris, MD, FACOG [facility], [facility] 07/08/2020  11:23 AM  

## 2020-08-02 ENCOUNTER — Telehealth: Payer: Self-pay

## 2020-08-02 NOTE — Telephone Encounter (Signed)
Pt calling the after hour nure 08/01/20 5:03pm c/o has a blighted ovum and has vaginal bleeding since 07/23/20; was just spotting dark blood but Sat/Sum started having huge clots and bad cramping; can fill a big pad in one hour; given misoprol for preg that is passing on it's own.  After hour nurse adv pt to be seen within 24hrs.  Called pt to f/u; she is currently at [facility] ED as she has moved to that area.  Adv it is our policy if saturating a pad q84min-1hr to be seen in ED so that is where she needs to be.

## 2020-08-12 ENCOUNTER — Ambulatory Visit (INDEPENDENT_AMBULATORY_CARE_PROVIDER_SITE_OTHER): Payer: Medicaid Other | Admitting: Obstetrics and Gynecology

## 2020-08-12 ENCOUNTER — Other Ambulatory Visit: Payer: Self-pay

## 2020-08-12 ENCOUNTER — Encounter: Payer: Self-pay | Admitting: Obstetrics and Gynecology

## 2020-08-12 VITALS — BP 118/70 | Ht 65.0 in | Wt 169.6 lb

## 2020-08-12 DIAGNOSIS — N760 Acute vaginitis: Secondary | ICD-10-CM

## 2020-08-12 DIAGNOSIS — R875 Abnormal microbiological findings in specimens from female genital organs: Secondary | ICD-10-CM

## 2020-08-12 MED ORDER — FLUCONAZOLE 150 MG PO TABS: 150.0000 mg | ORAL_TABLET | ORAL | 0 refills | Status: AC

## 2020-08-12 MED ORDER — METRONIDAZOLE 500 MG PO TABS: 500.0000 mg | ORAL_TABLET | Freq: Two times a day (BID) | ORAL | 0 refills | Status: AC

## 2020-08-12 NOTE — Progress Notes (Signed)
Patient ID: Charlotte Rodgers, female   DOB: 14-Oct-1985, 35 y.o.   MRN: [id number]  Reason for Consult: Gynecologic Exam   Referred by No ref. provider found  Subjective:     HPI:  Charlotte Rodgers is a 35 y.o. female.  I saw her previously for a missed abortion.  I prescribed her Cytotec.  She did not take the Cytotec.  However she began having spontaneous bleeding on March 7.  She was seen in the [facility] emergency room.  At [facility] she was told that she had completed her miscarriage, there is not evidence of retained products on her ultrasound.  She is following up today.  She it has been 10 days and she has had continued amounts of small bleeding.  She reports that the bleeding is very light only a small spot on her pad.  She reports that she has had some vaginal odor and is concerned regarding bacterial vaginosis.  She since she is having bleeding she declines examination today.    Past Medical History:  Diagnosis Date  . BRCA negative 06/2019   MyRisk neg except RAD51D VUS  . Endometriosis   . Family history of breast cancer 06/2019   IBIS=13.3%   Family History  Problem Relation Age of Onset  . Breast cancer Maternal Grandmother 52  . Ovarian cancer Maternal Grandmother   . Breast cancer Cousin 28  . Breast cancer Other 41  . Breast cancer Maternal Great-grandmother 80   History reviewed. No pertinent surgical history.  Short Social History:  Social History   Tobacco Use  . Smoking status: Never Smoker  . Smokeless tobacco: Never Used  Substance Use Topics  . Alcohol use: No    No Known Allergies  Current Outpatient Medications  Medication Sig Dispense Refill  . fluconazole (DIFLUCAN) 150 MG tablet Take 1 tablet (150 mg total) by mouth every 3 (three) days for 2 doses. 2 tablet 0  . metroNIDAZOLE (FLAGYL) 500 MG tablet Take 1 tablet (500 mg total) by mouth 2 (two) times daily for 7 days. 14 tablet 0  . misoprostol (CYTOTEC) 200 MCG tablet Place four tablets in between your gums  and cheeks (two tablets on each side) as instructed OR insert four tablets vaginally, repeat every 6-hrs as needed (Patient not taking: Reported on 08/12/2020) 12 tablet 0  . naproxen (NAPROSYN) 500 MG tablet Take 1 tablet (500 mg total) by mouth 2 (two) times daily with a meal. As needed for pain (Patient not taking: No sig reported) 30 tablet 1  . naproxen sodium (ANAPROX DS) 550 MG tablet Take 1 tablet (550 mg total) by mouth 2 (two) times daily as needed. (Patient not taking: No sig reported) 30 tablet 2  . ondansetron (ZOFRAN ODT) 4 MG disintegrating tablet Take 1 tablet (4 mg total) by mouth every 6 (six) hours as needed for nausea. (Patient not taking: No sig reported) 20 tablet 0   No current facility-administered medications for this visit.    Review of Systems  Constitutional: Negative for chills, fatigue, fever and unexpected weight change.  HENT: Negative for trouble swallowing.  Eyes: Negative for loss of vision.  Respiratory: Negative for cough, shortness of breath and wheezing.  Cardiovascular: Negative for chest pain, leg swelling, palpitations and syncope.  GI: Negative for abdominal pain, blood in stool, diarrhea, nausea and vomiting.  GU: Negative for difficulty urinating, dysuria, frequency and hematuria.  Musculoskeletal: Negative for back pain, leg pain and joint pain.  Skin: Negative for rash.  Neurological: Negative for dizziness, headaches, light-headedness, numbness and seizures.  Psychiatric: Negative for behavioral problem, confusion, depressed mood and sleep disturbance.        Objective:  Objective   Vitals:   08/12/20 1144  BP: 118/70  Weight: 169 lb 9.6 oz (76.9 kg)  Height: _0  (1.651 m)   Body mass index is 28.22 kg/m.  Physical Exam Vitals and nursing note reviewed. Exam conducted with a chaperone present.  Constitutional:      Appearance: Normal appearance.  HENT:     Head: Normocephalic and atraumatic.  Eyes:     Extraocular Movements:  Extraocular movements intact.     Pupils: Pupils are equal, round, and reactive to light.  Cardiovascular:     Rate and Rhythm: Normal rate and regular rhythm.  Pulmonary:     Effort: Pulmonary effort is normal.     Breath sounds: Normal breath sounds.  Abdominal:     General: Abdomen is flat.     Palpations: Abdomen is soft.  Genitourinary:    Comments: Patient declined vaginal exam today Musculoskeletal:     Cervical back: Normal range of motion.  Skin:    General: Skin is warm and dry.  Neurological:     General: No focal deficit present.     Mental Status: She is alert and oriented to person, place, and time.  Psychiatric:        Behavior: Behavior normal.        Thought Content: Thought content normal.        Judgment: Judgment normal.     Assessment/Plan:     35 year old status post miscarriage Discussed that small amounts of bleeding are very common in the first 2 to 4 weeks after miscarriage .  Encourage patient to call and return if her bleeding becomes heavy or if her menstrual cycle does not resume in a normal pattern.  We can consider pelvic US if normal menses does not resume  Presumptive treatment for bacterial vaginosis. Patient requests diflucan as well.  RX sent.  Nuswab sent- collected by patient.   More than 20 minutes were spent face to face with the patient in the room, reviewing the medical record, labs and images, and coordinating care for the patient. The plan of management was discussed in detail and counseling was provided.    Adrian Prows MD [facility], [facility] 08/12/2020 12:01 PM

## 2020-08-15 LAB — NUSWAB BV AND CANDIDA, NAA
Candida albicans, NAA: NEGATIVE
Candida glabrata, NAA: NEGATIVE

## 2020-10-27 IMAGING — US TRANSVAGINAL ULTRASOUND OF PELVIS
1 series · 13 of 25 positions shown · non-contrast
Comparison: None.

CLINICAL DATA: 32-year-old female with acute RIGHT pelvic pain for
4 days. History of endometriosis.

EXAM:
TRANSABDOMINAL AND TRANSVAGINAL ULTRASOUND OF PELVIS
DOPPLER ULTRASOUND OF OVARIES
TECHNIQUE: Both transabdominal and transvaginal ultrasound examinations of the
pelvis were performed. Transabdominal technique was performed for
global imaging of the pelvis including uterus, ovaries, adnexal
regions, and pelvic cul-de-sac.
It was necessary to proceed with endovaginal exam following the
transabdominal exam to visualize the endometrium and ovaries. Color
and duplex Doppler ultrasound was utilized to evaluate blood flow to
the ovaries.

[Series 1: transvaginal ultrasound of pelvis · 13 of 130 slices shown]
[im 1/130]
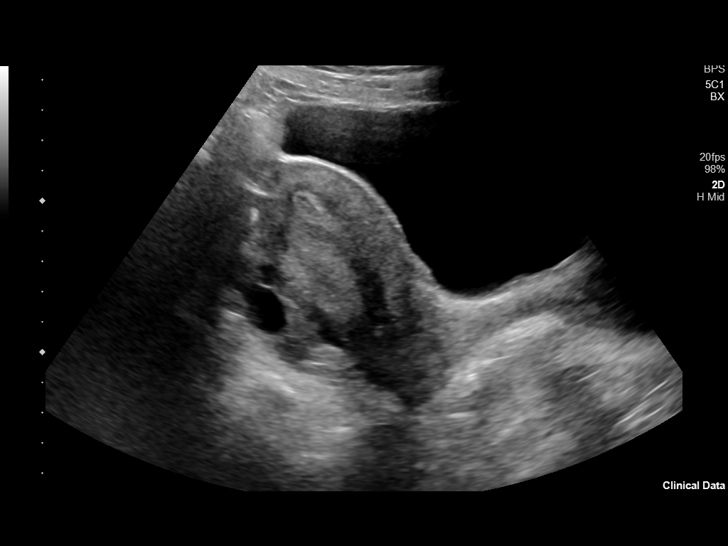
[im 11/130]
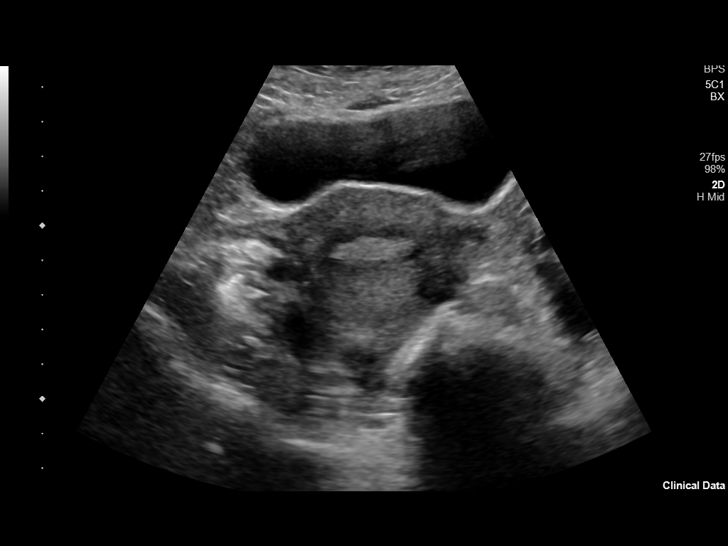
[im 22/130]
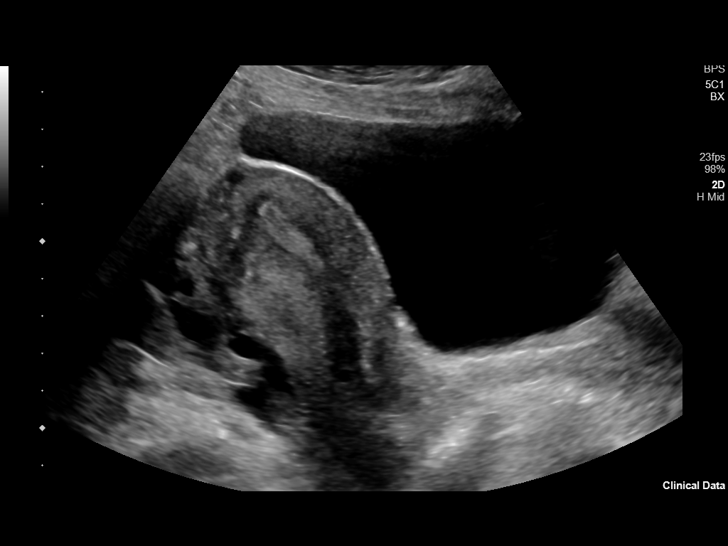
[im 33/130]
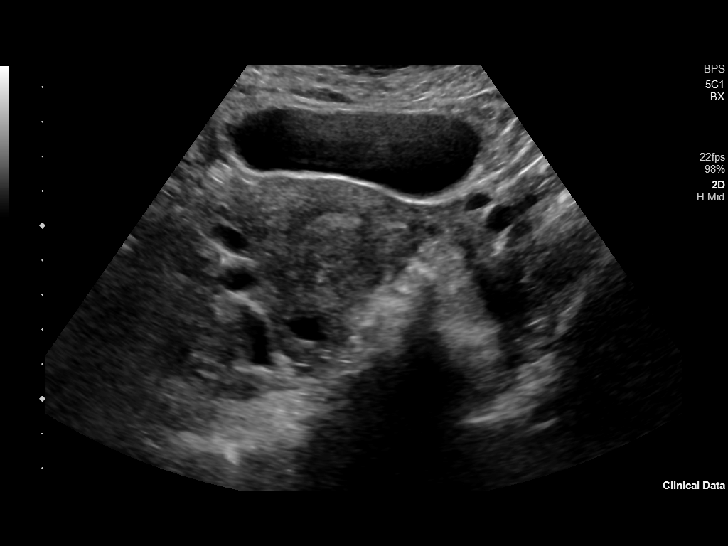
[im 44/130]
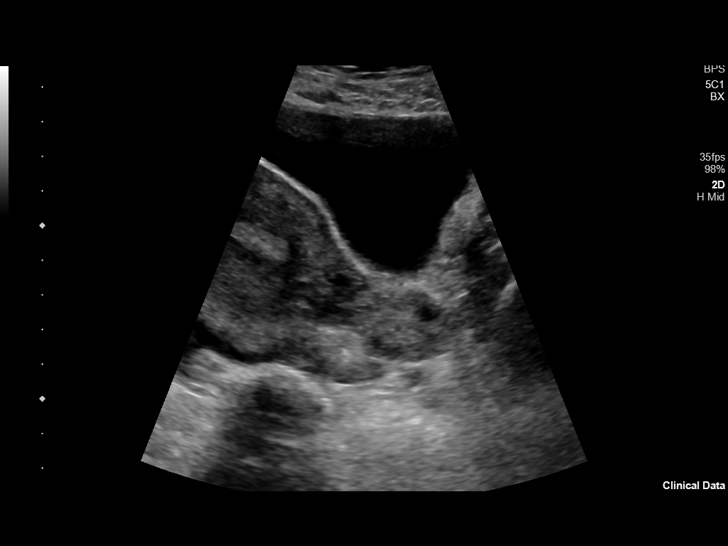
[im 54/130]
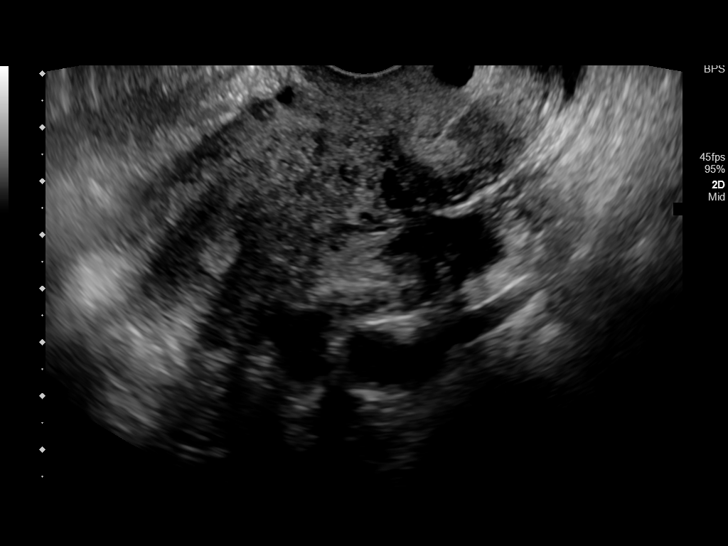
[im 65/130]
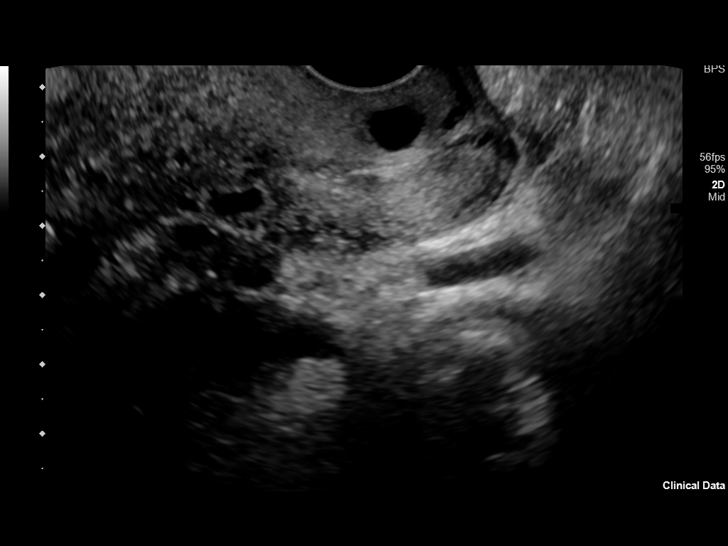
[im 76/130]
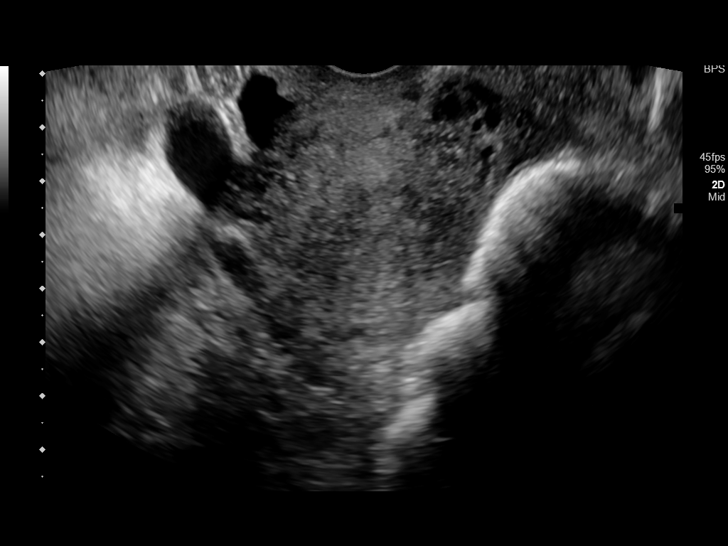
[im 87/130]
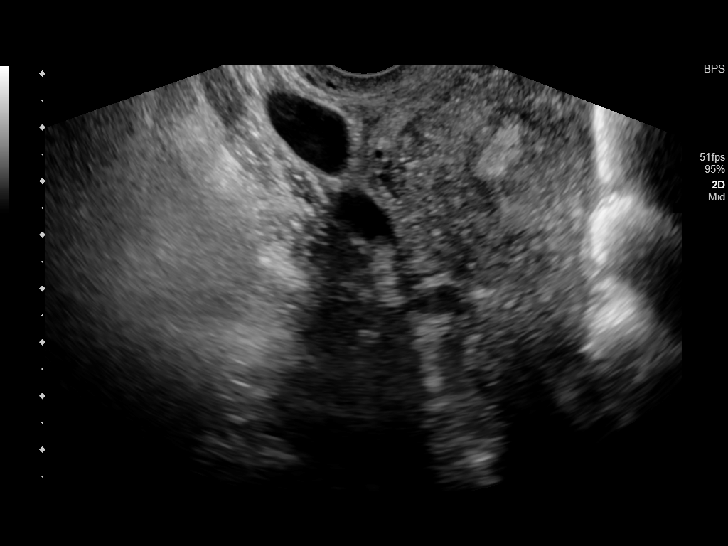
[im 97/130]
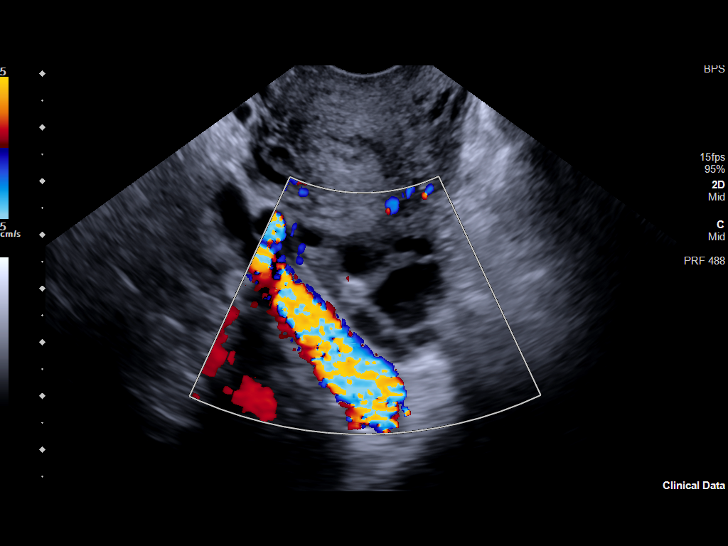
[im 108/130]
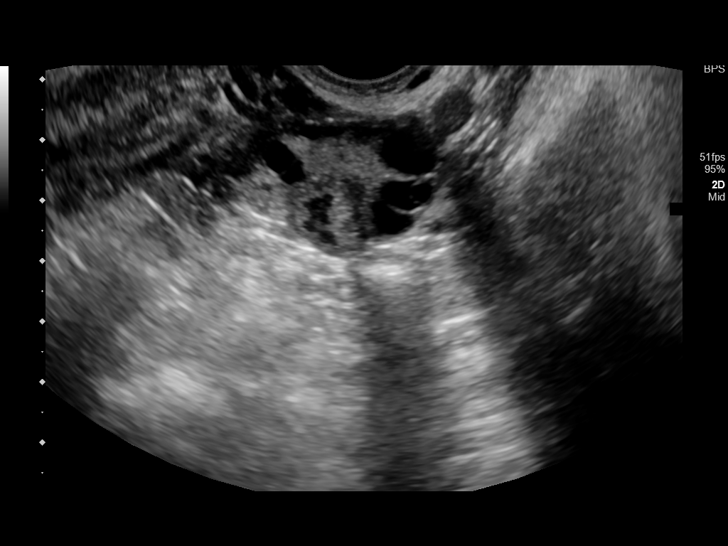
[im 119/130]
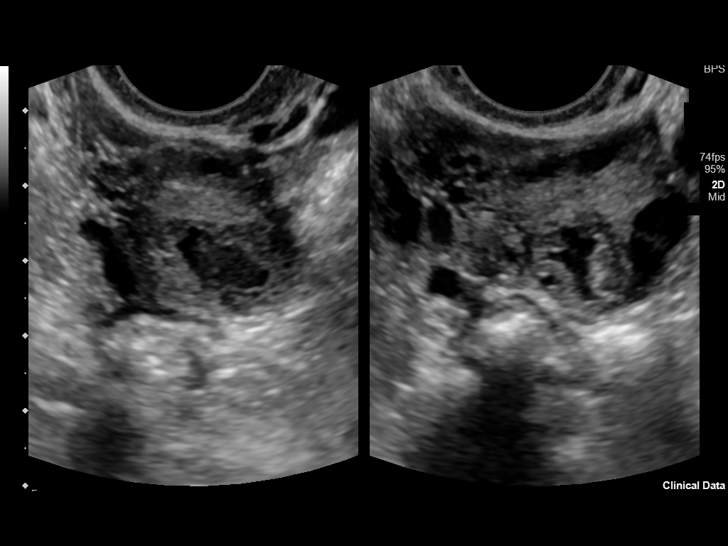
[im 130/130]
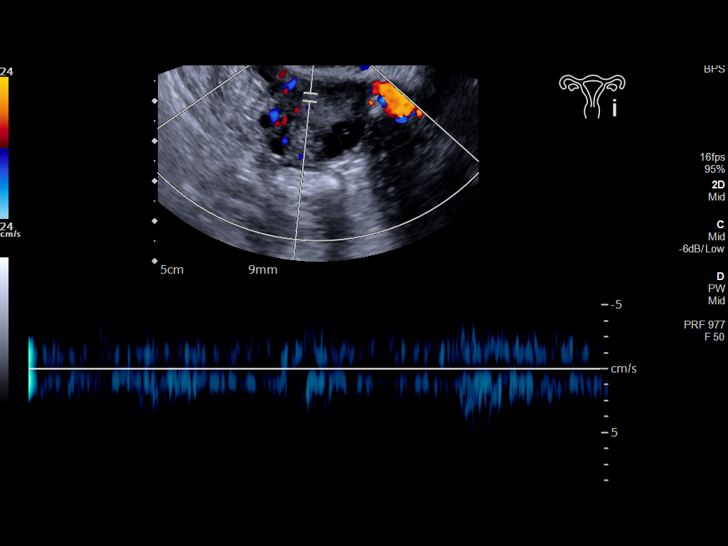

[13 of 25 positions shown; findings below may reference images not displayed]

FINDINGS: Uterus

Measurements: 9 x 4.4 x 5.4 cm = volume: 108 mL. No fibroids or
other mass visualized.

Endometrium

Thickness: 10 mm.  No focal abnormality visualized.

Right ovary

Measurements: 3.9 x 1.8 x 2 cm = volume: 7.3 mL. Normal
appearance/no suspicious adnexal mass.

Left ovary

Measurements: 3.1 x 2.4 x 2.4 cm = volume: 9.3 mL. Normal
appearance/no suspicious adnexal mass.

Pulsed Doppler evaluation of both ovaries demonstrates normal
low-resistance arterial and venous waveforms.

Other findings

A trace amount of free pelvic fluid is noted.
IMPRESSION: 1. Unremarkable pelvic ultrasound except for trace amount of free
fluid which may be physiologic. No evidence of ovarian torsion.

## 2020-10-27 IMAGING — CT CT ABDOMEN AND PELVIS WITH CONTRAST
2 of 4 series · 15 of 46 positions shown, 17 images · IV contrast (APPLIED)
Comparison: None.

CLINICAL DATA: Abdominal pain, primarily right-sided

EXAM:
CT ABDOMEN AND PELVIS WITH CONTRAST
TECHNIQUE: Multidetector CT imaging of the abdomen and pelvis was performed
using the standard protocol following bolus administration of
intravenous contrast.
CONTRAST:  100mL OMNIPAQUE IOHEXOL 300 MG/ML  SOLN

[Series 2: axial st · axial · 0.54mm/px · z∈[-876,-456]mm · 12 of 92 slices shown, 14 images]
[im 4/92  soft-tissue]
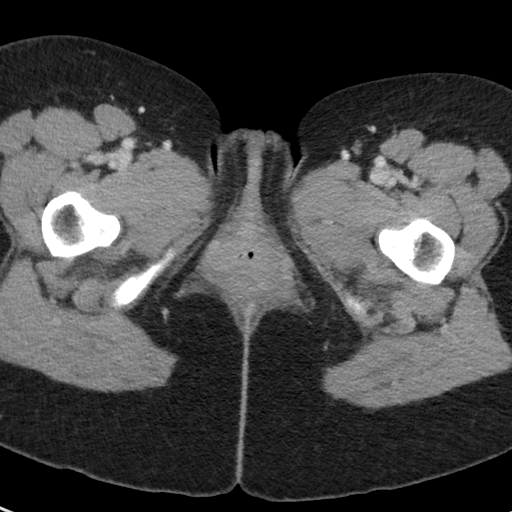
[im 4/92  bone]
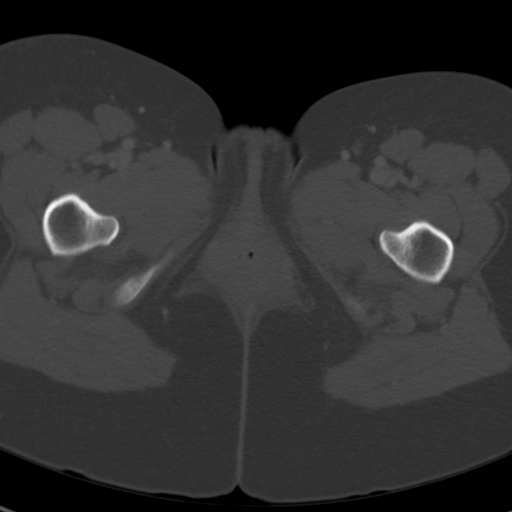
[im 12/92  soft-tissue]
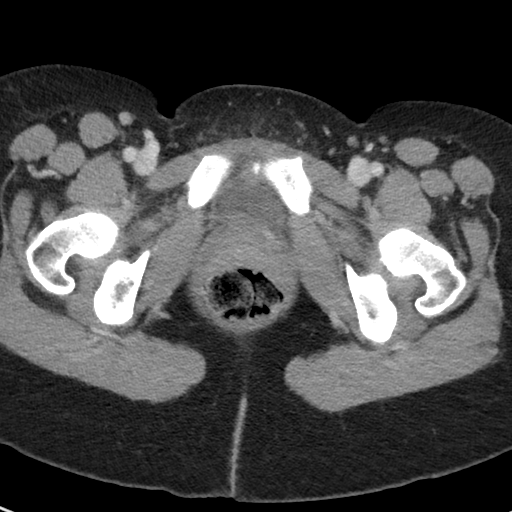
[im 19/92  soft-tissue]
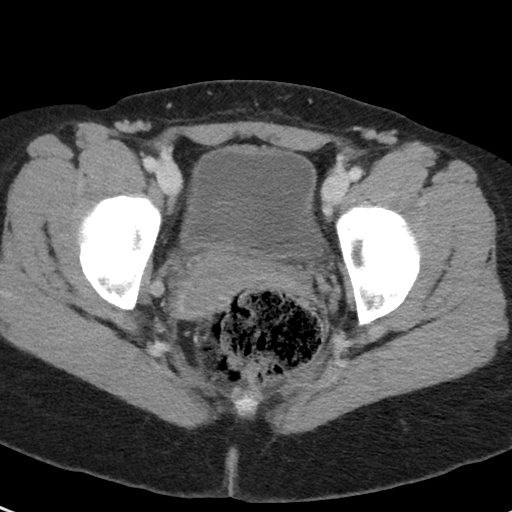
[im 27/92  soft-tissue]
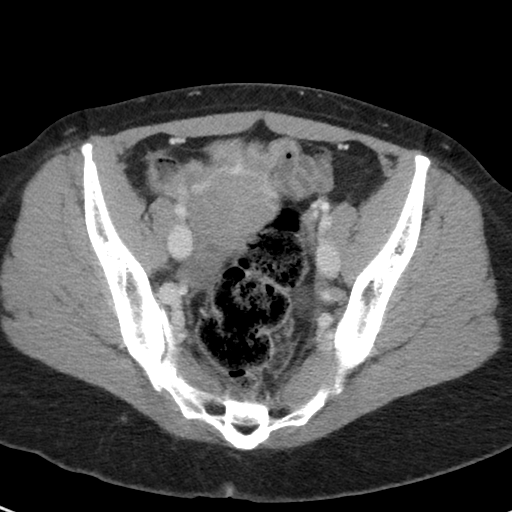
[im 35/92  soft-tissue]
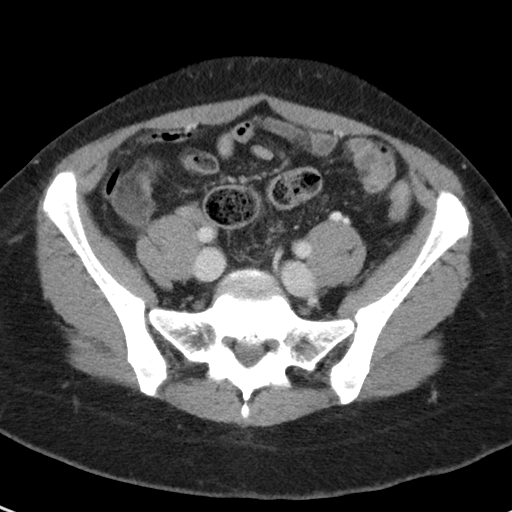
[im 42/92  soft-tissue]
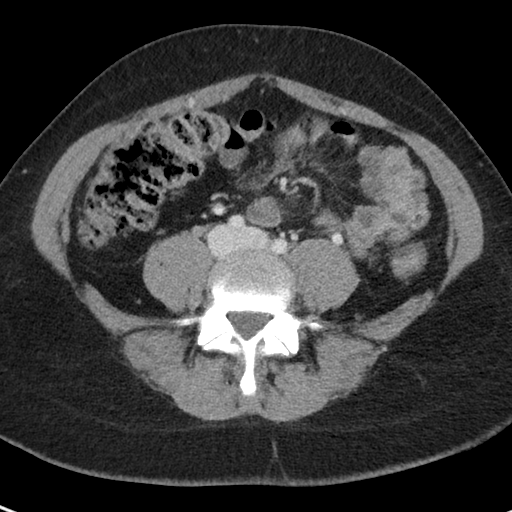
[im 50/92  soft-tissue]
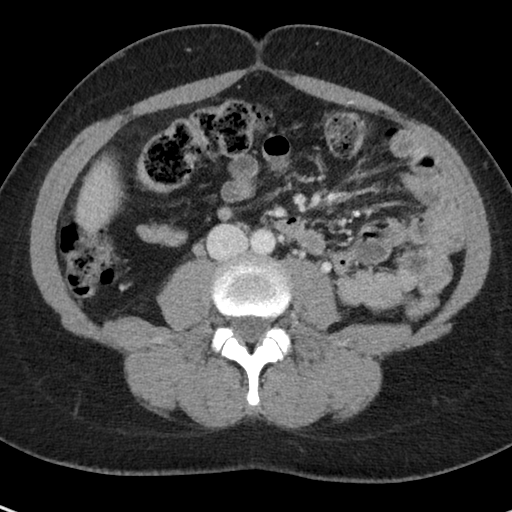
[im 57/92  soft-tissue]
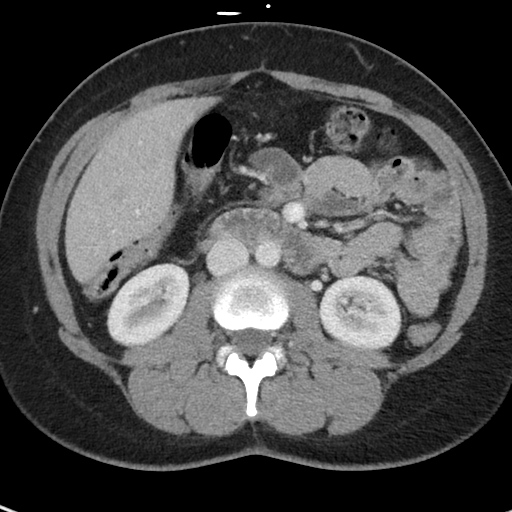
[im 65/92  soft-tissue]
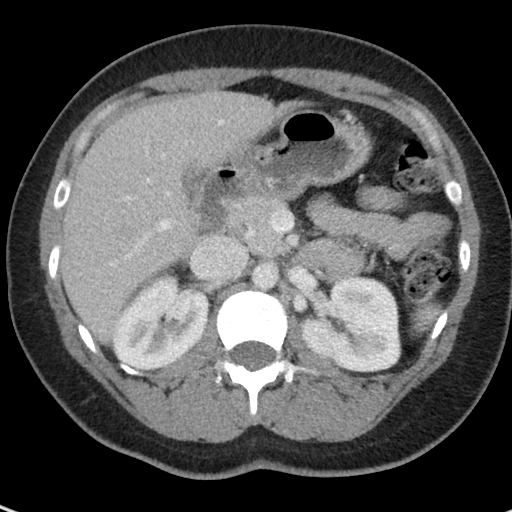
[im 65/92  bone]
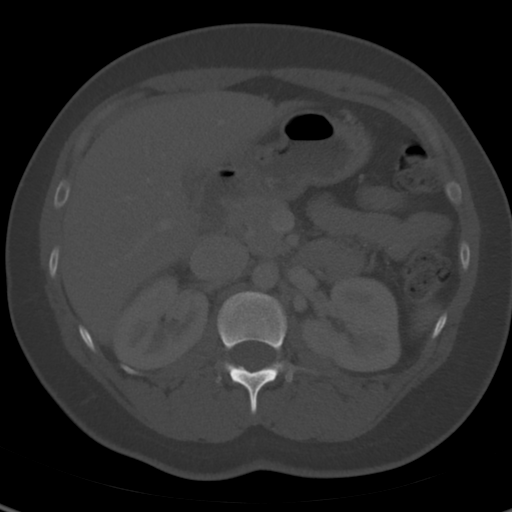
[im 73/92  soft-tissue]
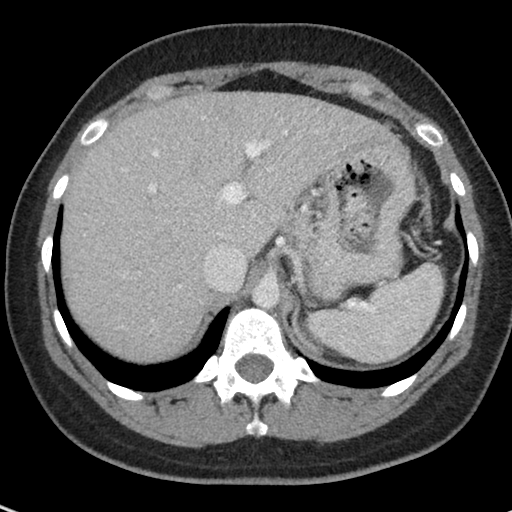
[im 80/92  soft-tissue]
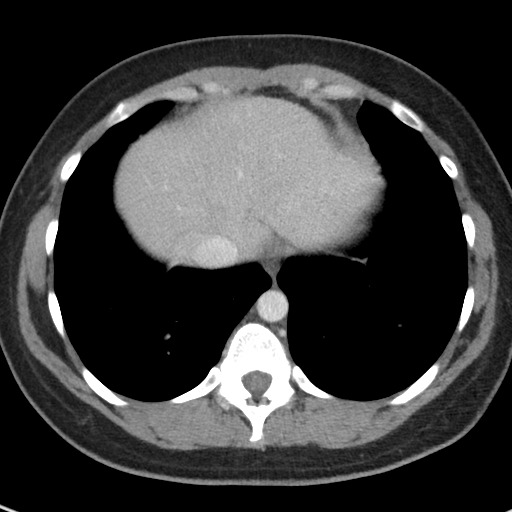
[im 88/92  soft-tissue]
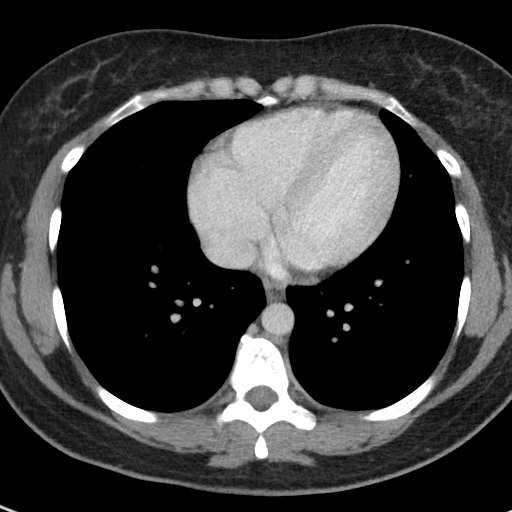

[Series 5: coronal st · coronal · 0.70mm/px · 3 of 89 slices shown]
[im 30/89  soft-tissue]
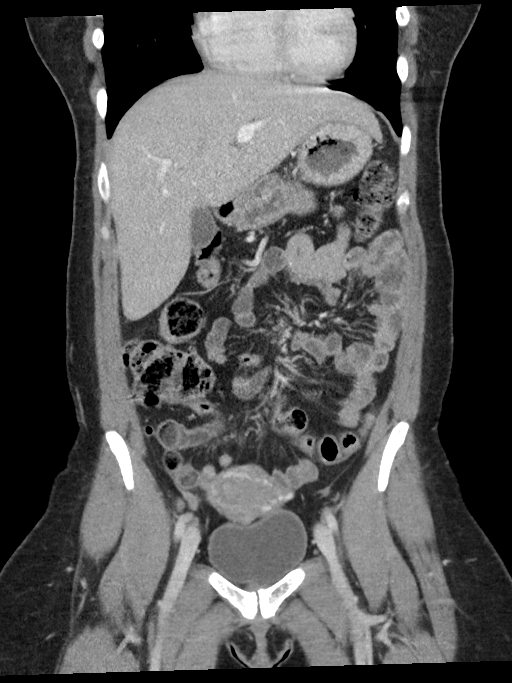
[im 40/89  soft-tissue]
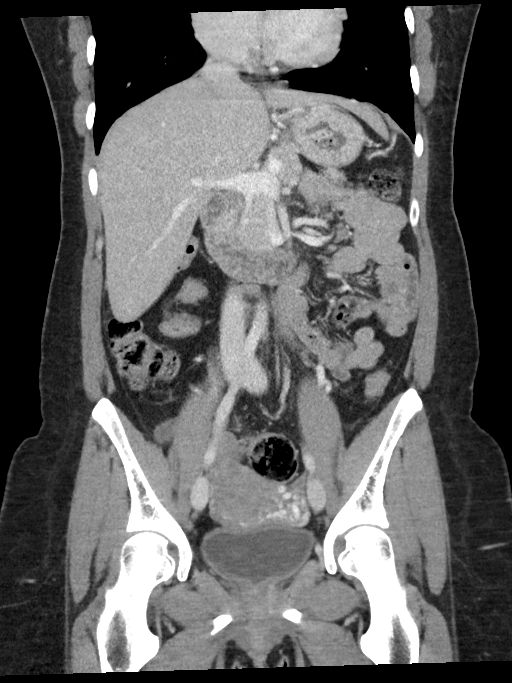
[im 49/89  soft-tissue]
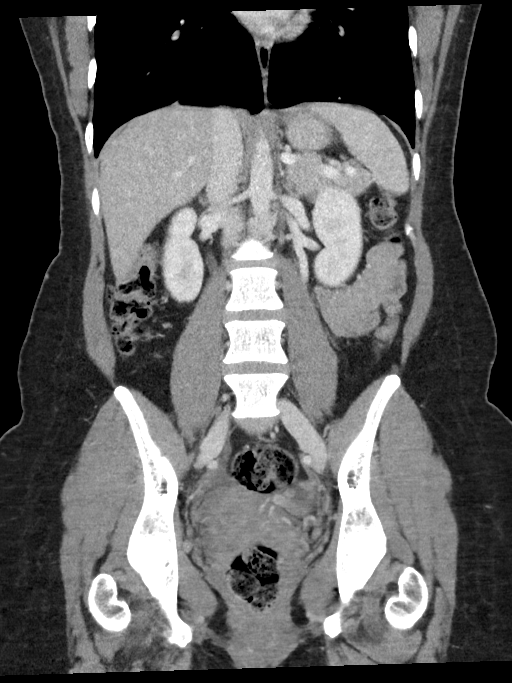

[15 of 46 positions shown; findings below may reference images not displayed]

FINDINGS: Lower chest: Lung bases are clear.

Hepatobiliary: No focal liver lesions are appreciable. The
gallbladder wall is not appreciably thickened. There is no biliary
duct dilatation.

Pancreas: No pancreatic mass or inflammatory focus.

Spleen: No splenic lesions are evident.

Adrenals/Urinary Tract: Adrenals bilaterally appear unremarkable.
Kidneys bilaterally show no appreciable mass or hydronephrosis on
either side. There is no evident renal or ureteral calculus. Urinary
bladder is midline with wall thickness within normal limits.

Stomach/Bowel: Rectum is mildly distended with stool. There is no
perirectal wall thickening or soft tissue stranding. There is
moderate stool elsewhere in the colon. There is no appreciable bowel
wall or mesenteric thickening. There is no appreciable bowel
obstruction. There is no free air or portal venous air.

Vascular/Lymphatic: There is no abdominal aortic aneurysm. No
vascular lesions are evident. There is a circumaortic left renal
vein, an anatomic variant. There is no evident adenopathy in the
abdomen or pelvis.

Reproductive: The uterus is anteverted. There is no appreciable
pelvic mass. There are prominent periuterine vascular structures,
particularly on the left.

Other: Appendix appears normal. There is no abscess or ascites in
the abdomen or pelvis. There is a minimal ventral hernia containing
only fat.

Musculoskeletal: No blastic or lytic bone lesions. No intramuscular
lesions evident.
IMPRESSION: 1. A cause for patient's symptoms has not been established
definitively with this study.

2. No bowel obstruction. No abscess in the abdomen pelvis. The
appendix appears normal.

3. No appreciable renal or ureteral calculus. No hydronephrosis.
Urinary bladder wall thickness is normal.

4. Prominent periuterine vascular structures on the left. In the
appropriate clinical setting, this finding could be indicative of a
degree of pelvic congestion syndrome.

5.  Minimal ventral hernia containing only fat.
